# Patient Record
Sex: Female | Born: 1990 | Race: Black or African American | Hispanic: No | Marital: Married | Smoking: Never smoker
Health system: Southern US, Community
[De-identification: ages and names within clinical notes are randomized; demographics above are authoritative.]

## PROBLEM LIST (undated history)

## (undated) DIAGNOSIS — R87619 Unspecified abnormal cytological findings in specimens from cervix uteri: Secondary | ICD-10-CM

## (undated) DIAGNOSIS — K5900 Constipation, unspecified: Secondary | ICD-10-CM

## (undated) DIAGNOSIS — D563 Thalassemia minor: Secondary | ICD-10-CM

## (undated) HISTORY — DX: Thalassemia minor: D56.3

## (undated) HISTORY — PX: DG SELECTED HSG GDC ONLY: HXRAD357

## (undated) HISTORY — PX: HYSTEROSALPINGOGRAM: SHX6581

## (undated) HISTORY — DX: Unspecified abnormal cytological findings in specimens from cervix uteri: R87.619

---
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
## 2009-04-26 ENCOUNTER — Ambulatory Visit: Payer: Self-pay | Admitting: Pediatrics

## 2009-05-10 ENCOUNTER — Ambulatory Visit: Payer: Self-pay | Admitting: Internal Medicine

## 2010-07-17 DIAGNOSIS — R87619 Unspecified abnormal cytological findings in specimens from cervix uteri: Secondary | ICD-10-CM

## 2010-07-17 HISTORY — DX: Unspecified abnormal cytological findings in specimens from cervix uteri: R87.619

## 2013-02-20 ENCOUNTER — Ambulatory Visit: Payer: Self-pay | Admitting: Obstetrics and Gynecology

## 2013-11-25 ENCOUNTER — Ambulatory Visit: Payer: Self-pay | Admitting: Emergency Medicine

## 2014-01-25 ENCOUNTER — Ambulatory Visit: Payer: Self-pay | Admitting: Family Medicine

## 2014-01-25 LAB — COMPREHENSIVE METABOLIC PANEL
ALK PHOS: 42 U/L — AB
AST: 17 U/L (ref 15–37)
Albumin: 4.1 g/dL (ref 3.4–5.0)
Anion Gap: 9 (ref 7–16)
BILIRUBIN TOTAL: 0.5 mg/dL (ref 0.2–1.0)
BUN: 7 mg/dL (ref 7–18)
CALCIUM: 9 mg/dL (ref 8.5–10.1)
Chloride: 104 mmol/L (ref 98–107)
Co2: 26 mmol/L (ref 21–32)
Creatinine: 0.64 mg/dL (ref 0.60–1.30)
EGFR (African American): >60
GLUCOSE: 78 mg/dL (ref 65–99)
Osmolality: 274 (ref 275–301)
Potassium: 3.4 mmol/L — ABNORMAL LOW (ref 3.5–5.1)
SGPT (ALT): 20 U/L (ref 12–78)
Sodium: 139 mmol/L (ref 136–145)
Total Protein: 7.7 g/dL (ref 6.4–8.2)

## 2014-04-11 ENCOUNTER — Emergency Department: Payer: Self-pay | Admitting: Student

## 2014-08-28 LAB — HM PAP SMEAR: HM Pap smear: NORMAL

## 2015-05-09 ENCOUNTER — Ambulatory Visit
Admission: EM | Admit: 2015-05-09 | Discharge: 2015-05-09 | Disposition: A | Discharge status: Home / Self Care | Payer: BLUE CROSS/BLUE SHIELD | Attending: Family Medicine | Admitting: Family Medicine

## 2015-05-09 DIAGNOSIS — K649 Unspecified hemorrhoids: Secondary | ICD-10-CM | POA: Diagnosis not present

## 2015-05-09 HISTORY — DX: Constipation, unspecified: K59.00

## 2015-05-09 LAB — CBC WITH DIFFERENTIAL/PLATELET
BASOS ABS: 0.1 10*3/uL (ref 0–0.1)
BASOS PCT: 1 %
EOS PCT: 1 %
Eosinophils Absolute: 0.1 10*3/uL (ref 0–0.7)
HCT: 39.5 % (ref 35.0–47.0)
Hemoglobin: 12.9 g/dL (ref 12.0–16.0)
Lymphocytes Relative: 29 %
Lymphs Abs: 1.7 10*3/uL (ref 1.0–3.6)
MCH: 28.3 pg (ref 26.0–34.0)
MCHC: 32.7 g/dL (ref 32.0–36.0)
MCV: 86.6 fL (ref 80.0–100.0)
Monocytes Absolute: 0.6 10*3/uL (ref 0.2–0.9)
Monocytes Relative: 10 %
Neutro Abs: 3.6 10*3/uL (ref 1.4–6.5)
Neutrophils Relative %: 59 %
Platelets: 245 10*3/uL (ref 150–440)
RBC: 4.56 MIL/uL (ref 3.80–5.20)
RDW: 12.9 % (ref 11.5–14.5)
WBC: 6.1 10*3/uL (ref 3.6–11.0)

## 2015-05-09 LAB — OCCULT BLOOD X 1 CARD TO LAB, STOOL: FECAL OCCULT BLD: POSITIVE — AB

## 2015-05-09 MED ORDER — HYDROCORTISONE ACE-PRAMOXINE 1-1 % RE FOAM
1.0000 | Freq: Two times a day (BID) | RECTAL | Status: AC
Start: 2015-05-09 — End: 2015-05-16

## 2015-05-09 MED ORDER — DOCUSATE SODIUM 100 MG PO CAPS: 100.0000 mg | ORAL_CAPSULE | Freq: Two times a day (BID) | ORAL | Status: DC

## 2015-05-09 NOTE — ED Provider Notes (Signed)
[facility] Emergency Department Provider Note  ____________________________________________  Time seen: Approximately 10:26 AM  I have reviewed the triage vital signs and the nursing notes.   HISTORY  Chief Complaint Hemorrhoids   HPI Maureen Black is a 24 y.o. female presents for complaint of hemorrhoid pain. Patient reports that she has intermittently had hemorrhoids times years. Reports this past Monday she felt like she had to strain during the bowel movement and states that her stool was hard. Patient states that after the bowel movement she wiped and noticed a small amount of blood on the tissue. Patient reports that throughout the week she has had some intermittent rectal discomfort and states that she looked at the area and it appeared to be a hemorrhoid. Patient states the discomfort is only when walking or with bowel movement. Patient states that this morning was her last bowel movement. States that her bowel movement this morning was fairly normal and did not really have to strain. reports that when she wiped this morning she had bright red blood present on the tissue. States that she noticed 1-2 drops of blood in the toilet water. But states that she saw that it came from the hemorrhoid. Patient denies blood in stool. Denies bleeding in absence of bowel movement. States no bleeding regularly. Denies vaginal bleeding or blood in urine. Reports continues to pass flatus well.   Patient reports that she continues to eat and drink well. Denies nausea, vomiting or diarrhea. Patient reports that she has a chronic history of constipation and her current bowel movements are similar to her normal pattern. States that she normally has a bowel movement once or twice per week. States that this is have been her pattern for years. Patient again denies nausea, vomiting, abdominal pain. Patient states at this time the hemorrhoid is not hurting her but earlier today pain was  4/10. States that pain for the hemorrhoid today was only during bowel movement. Patient states that she is not in the past had hemorrhoids that bled. Denies rectal or vaginal trauma.  Patient reports last ventral cycle was 2 weeks ago. Patient states that she is not currently sexually active and denies chance of pregnancy.   Past Medical History  Diagnosis Date  . Constipation   Hemorrhoids  There are no active problems to display for this patient.   History reviewed. No pertinent past surgical history.  Current Outpatient Rx  Name  Route  Sig  Dispense  Refill  .           Marland Kitchen             Allergies Review of patient's allergies indicates no known allergies.  Family History  Problem Relation Age of Onset  . Hypertension Mother     Social History Social History  Substance Use Topics  . Smoking status: Former Games developer  . Smokeless tobacco: None  . Alcohol Use: Yes     Comment: socially    Review of Systems Constitutional: No fever/chills Eyes: No visual changes. ENT: No sore throat. Cardiovascular: Denies chest pain. Respiratory: Denies shortness of breath. Gastrointestinal: No abdominal pain.  No nausea, no vomiting.  No diarrhea. Positive intermittent constipation and hemorrhoids.  Genitourinary: Negative for dysuria. Musculoskeletal: Negative for back pain. Skin: Negative for rash. Neurological: Negative for headaches, focal weakness or numbness.  10-point ROS otherwise negative.  ____________________________________________   PHYSICAL EXAM:  VITAL SIGNS: ED Triage Vitals  Enc Vitals Group     BP 05/09/15 0926  106/67 mmHg     Pulse Rate 05/09/15 0926 82     Resp 05/09/15 0926 16     Temp 05/09/15 0926 98 F (36.7 C)     Temp Source 05/09/15 0926 Oral     SpO2 05/09/15 0926 100 %     Weight 05/09/15 0926 105 lb (47.628 kg)     Height 05/09/15 0926  (1.575 m)     Head Cir --      Peak Flow --      Pain Score 05/09/15 0928 4     Pain Loc --       Pain Edu? --      Excl. in GC? --     Constitutional: Alert and oriented. Well appearing and in no acute distress. Eyes: Conjunctivae are normal. PERRL. EOMI. Head: Atraumatic.  Nose: No congestion/rhinnorhea.  Mouth/Throat: Mucous membranes are moist.  Oropharynx non-erythematous. Neck: No stridor.  No cervical spine tenderness to palpation. Hematological/Lymphatic/Immunilogical: No cervical lymphadenopathy. Cardiovascular: Normal rate, regular rhythm. Grossly normal heart sounds.  Good peripheral circulation. Respiratory: Normal respiratory effort.  No retractions. Lungs CTAB. Gastrointestinal: Soft and nontender. No distention. Normal Bowel sounds. No CVA tenderness. Rectal: Exam completed with RN Beryle Quant at beside. Patient with external non-thrombosed posterior hemorrhoid present, mild to mod TTP. Rectal exam performed with normal stool color and no gross blood present, however external hemorrhoid with scant amount blood present. No internal hemorrhoid palpated.  Musculoskeletal: No lower or upper extremity tenderness nor edema.  No joint effusions. Bilateral pedal pulses equal and easily palpated.  Neurologic:  Normal speech and language. No gross focal neurologic deficits are appreciated. No gait instability. Skin:  Skin is warm, dry and intact. No rash noted. Psychiatric: Mood and affect are normal. Speech and behavior are normal.  ____________________________________________   LABS (all labs ordered are listed, but only abnormal results are displayed)  Labs Reviewed  OCCULT BLOOD X 1 CARD TO LAB, STOOL - Abnormal; Notable for the following:    Fecal Occult Bld POSITIVE (*)    All other components within normal limits  CBC WITH DIFFERENTIAL/PLATELET   _______________________________________   INITIAL IMPRESSION / ASSESSMENT AND PLAN / ED COURSE  Pertinent labs & imaging results that were available during my care of the patient were reviewed by me and considered in my  medical decision making (see chart for details).  Very well-appearing patient. No acute distress. Presents for the complaint of hemorrhoid. Patient reports that last bowel movement was this morning and states that she had some bright red blood present on tissue after wiping. Abdomen soft and nontender with normal bowel sounds. Patient with external nonthrombosed hemorrhoid present with scant amount of freshly clotted blood at hemorrhoid. Rectal exam was completed with RN at bedside. Stool normal color however fecal occult was positive, suspect this was due to hemorrhoid that had blood present. No gross blood present. As occult was positive also evaluate CBC. Suspect occult positive due to hemorrhoid.   CBC reviewed, and within normal parameters. Discussed in detail with patient and mother at bedside for patient to closely monitor symptoms. As well as to have close follow-up with primary care physician. Patient is to follow-up with PCP in the next 2-3 days with primary care physician. Will start patient on oral Metamucil as well as stool softener, and patient to apply Proctofoam HC. States will get metamucil over the counter. Discussed good bowel habits, water intake, and to avoid straining with bowel movements. Counseled regarding needs to  return immediately or proceed to the ER including abdominal pain, rectal bleeding, increased pain, inability to pass stool,  blood in stool or filling toilet,  fever, worsening concerns.Discussed follow up with Primary care physician this week. Discussed follow up and return parameters including no resolution or any worsening concerns. Patient verbalized understanding and agreed to plan.    ____________________________________________   FINAL CLINICAL IMPRESSION(S) / ED DIAGNOSES  Final diagnoses:  Hemorrhoids, unspecified hemorrhoid type       Renford DillsLindsey Reyaan Thoma, NP 05/09/15 1122  Renford DillsLindsey Shia Delaine, NP 05/09/15 1124

## 2015-05-09 NOTE — ED Notes (Signed)
External hemorrhoid started Monday. States very large in size. This morning states noticed dry blood when wiping, then after a BM, has "a lot of blood"

## 2015-05-09 NOTE — Discharge Instructions (Signed)
Take medication as prescribed. Drink plenty of water. Use over-the-counter Metamucil per label instructions as discussed.  As discussed you need to follow-up closely with primary care physician this week. You can also follow up with gastroenterology, see above as needed. Return to urgent care proceed to the ER for abdominal pain, fever, bleeding, blood fill-in toilet bowl, blood in stool, new or worsening concerns.  Hemorrhoids Hemorrhoids are swollen veins around the rectum or anus. There are two types of hemorrhoids:   Internal hemorrhoids. These occur in the veins just inside the rectum. They may poke through to the outside and become irritated and painful.  External hemorrhoids. These occur in the veins outside the anus and can be felt as a painful swelling or hard lump near the anus. CAUSES  Pregnancy.   Obesity.   Constipation or diarrhea.   Straining to have a bowel movement.   Sitting for long periods on the toilet.  Heavy lifting or other activity that caused you to strain.  Anal intercourse. SYMPTOMS   Pain.   Anal itching or irritation.   Rectal bleeding.   Fecal leakage.   Anal swelling.   One or more lumps around the anus.  DIAGNOSIS  Your caregiver may be able to diagnose hemorrhoids by visual examination. Other examinations or tests that may be performed include:   Examination of the rectal area with a gloved hand (digital rectal exam).   Examination of anal canal using a small tube (scope).   A blood test if you have lost a significant amount of blood.  A test to look inside the colon (sigmoidoscopy or colonoscopy). TREATMENT Most hemorrhoids can be treated at home. However, if symptoms do not seem to be getting better or if you have a lot of rectal bleeding, your caregiver may perform a procedure to help make the hemorrhoids get smaller or remove them completely. Possible treatments include:   Placing a rubber band at the base of the  hemorrhoid to cut off the circulation (rubber band ligation).   Injecting a chemical to shrink the hemorrhoid (sclerotherapy).   Using a tool to burn the hemorrhoid (infrared light therapy).   Surgically removing the hemorrhoid (hemorrhoidectomy).   Stapling the hemorrhoid to block blood flow to the tissue (hemorrhoid stapling).  HOME CARE INSTRUCTIONS   Eat foods with fiber, such as whole grains, beans, nuts, fruits, and vegetables. Ask your doctor about taking products with added fiber in them (fibersupplements).  Increase fluid intake. Drink enough water and fluids to keep your urine clear or pale yellow.   Exercise regularly.   Go to the bathroom when you have the urge to have a bowel movement. Do not wait.   Avoid straining to have bowel movements.   Keep the anal area dry and clean. Use wet toilet paper or moist towelettes after a bowel movement.   Medicated creams and suppositories may be used or applied as directed.   Only take over-the-counter or prescription medicines as directed by your caregiver.   Take warm sitz baths for 15-20 minutes, 3-4 times a day to ease pain and discomfort.   Place ice packs on the hemorrhoids if they are tender and swollen. Using ice packs between sitz baths may be helpful.   Put ice in a plastic bag.   Place a towel between your skin and the bag.   Leave the ice on for 15-20 minutes, 3-4 times a day.   Do not use a donut-shaped pillow or sit on the toilet  for long periods. This increases blood pooling and pain.  SEEK MEDICAL CARE IF:  You have increasing pain and swelling that is not controlled by treatment or medicine.  You have uncontrolled bleeding.  You have difficulty or you are unable to have a bowel movement.  You have pain or inflammation outside the area of the hemorrhoids. MAKE SURE YOU:  Understand these instructions.  Will watch your condition.  Will get help right away if you are not doing well  or get worse.   This information is not intended to replace advice given to you by your health care provider. Make sure you discuss any questions you have with your health care provider.   Document Released: 06/30/2000 Document Revised: 06/19/2012 Document Reviewed: 05/07/2012 Elsevier Interactive Patient Education 2016 Elsevier Inc.  High-Fiber Diet Fiber, also called dietary fiber, is a type of carbohydrate found in fruits, vegetables, whole grains, and beans. A high-fiber diet can have many health benefits. Your health care provider may recommend a high-fiber diet to help:  Prevent constipation. Fiber can make your bowel movements more regular.  Lower your cholesterol.  Relieve hemorrhoids, uncomplicated diverticulosis, or irritable bowel syndrome.  Prevent overeating as part of a weight-loss plan.  Prevent heart disease, type 2 diabetes, and certain cancers. WHAT IS MY PLAN? The recommended daily intake of fiber includes:  38 grams for men under age 26.  30 grams for men over age 26.  25 grams for women under age 26.  21 grams for women over age 350. You can get the recommended daily intake of dietary fiber by eating a variety of fruits, vegetables, grains, and beans. Your health care provider may also recommend a fiber supplement if it is not possible to get enough fiber through your diet. WHAT DO I NEED TO KNOW ABOUT A HIGH-FIBER DIET?  Fiber supplements have not been widely studied for their effectiveness, so it is better to get fiber through food sources.  Always check the fiber content on thenutrition facts label of any prepackaged food. Look for foods that contain at least 5 grams of fiber per serving.  Ask your dietitian if you have questions about specific foods that are related to your condition, especially if those foods are not listed in the following section.  Increase your daily fiber consumption gradually. Increasing your intake of dietary fiber too quickly  may cause bloating, cramping, or gas.  Drink plenty of water. Water helps you to digest fiber. WHAT FOODS CAN I EAT? Grains Whole-grain breads. Multigrain cereal. Oats and oatmeal. Brown rice. Barley. Bulgur wheat. Millet. Bran muffins. Popcorn. Rye wafer crackers. Vegetables Sweet potatoes. Spinach. Kale. Artichokes. Cabbage. Broccoli. Green peas. Carrots. Squash. Fruits Berries. Pears. Apples. Oranges. Avocados. Prunes and raisins. Dried figs. Meats and Other Protein Sources Navy, kidney, pinto, and soy beans. Split peas. Lentils. Nuts and seeds. Dairy Fiber-fortified yogurt. Beverages Fiber-fortified soy milk. Fiber-fortified orange juice. Other Fiber bars. The items listed above may not be a complete list of recommended foods or beverages. Contact your dietitian for more options. WHAT FOODS ARE NOT RECOMMENDED? Grains White bread. Pasta made with refined flour. White rice. Vegetables Fried potatoes. Canned vegetables. Well-cooked vegetables.  Fruits Fruit juice. Cooked, strained fruit. Meats and Other Protein Sources Fatty cuts of meat. Fried Environmental education officerpoultry or fried fish. Dairy Milk. Yogurt. Cream cheese. Sour cream. Beverages Soft drinks. Other Cakes and pastries. Butter and oils. The items listed above may not be a complete list of foods and beverages to avoid. Contact your  dietitian for more information. WHAT ARE SOME TIPS FOR INCLUDING HIGH-FIBER FOODS IN MY DIET?  Eat a wide variety of high-fiber foods.  Make sure that half of all grains consumed each day are whole grains.  Replace breads and cereals made from refined flour or white flour with whole-grain breads and cereals.  Replace white rice with brown rice, bulgur wheat, or millet.  Start the day with a breakfast that is high in fiber, such as a cereal that contains at least 5 grams of fiber per serving.  Use beans in place of meat in soups, salads, or pasta.  Eat high-fiber snacks, such as berries, raw  vegetables, nuts, or popcorn.   This information is not intended to replace advice given to you by your health care provider. Make sure you discuss any questions you have with your health care provider.   Document Released: 07/03/2005 Document Revised: 07/24/2014 Document Reviewed: 12/16/2013 Elsevier Interactive Patient Education [employer]

## 2017-07-01 ENCOUNTER — Other Ambulatory Visit: Payer: Self-pay

## 2017-07-01 ENCOUNTER — Ambulatory Visit
Admission: EM | Admit: 2017-07-01 | Discharge: 2017-07-01 | Disposition: A | Discharge status: Home / Self Care | Payer: BLUE CROSS/BLUE SHIELD | Attending: Family Medicine | Admitting: Family Medicine

## 2017-07-01 DIAGNOSIS — R350 Frequency of micturition: Secondary | ICD-10-CM | POA: Diagnosis not present

## 2017-07-01 DIAGNOSIS — N390 Urinary tract infection, site not specified: Secondary | ICD-10-CM | POA: Diagnosis not present

## 2017-07-01 DIAGNOSIS — N898 Other specified noninflammatory disorders of vagina: Secondary | ICD-10-CM

## 2017-07-01 DIAGNOSIS — R300 Dysuria: Secondary | ICD-10-CM | POA: Diagnosis not present

## 2017-07-01 DIAGNOSIS — R319 Hematuria, unspecified: Secondary | ICD-10-CM | POA: Diagnosis not present

## 2017-07-01 LAB — URINALYSIS, COMPLETE (UACMP) WITH MICROSCOPIC
Bilirubin Urine: NEGATIVE
Glucose, UA: NEGATIVE mg/dL
Ketones, ur: NEGATIVE mg/dL
Nitrite: NEGATIVE
Protein, ur: NEGATIVE mg/dL
SPECIFIC GRAVITY, URINE: 1.025 (ref 1.005–1.030)
pH: 6.5 (ref 5.0–8.0)

## 2017-07-01 LAB — WET PREP, GENITAL
CLUE CELLS WET PREP: NONE SEEN
SPERM: NONE SEEN
Trich, Wet Prep: NONE SEEN
YEAST WET PREP: NONE SEEN

## 2017-07-01 MED ORDER — CEPHALEXIN 500 MG PO CAPS: 500.0000 mg | ORAL_CAPSULE | Freq: Two times a day (BID) | ORAL | 0 refills | Status: AC

## 2017-07-01 NOTE — ED Triage Notes (Signed)
Pt with 4 days of dysuria (mild) but much worse starting last night. Also started to notice frequency.

## 2017-07-01 NOTE — Discharge Instructions (Signed)
Take medication as prescribed. Rest. Drink plenty of fluids.  ° °Follow up with your primary care physician this week as needed. Return to Urgent care for new or worsening concerns.  ° °

## 2017-07-01 NOTE — ED Provider Notes (Signed)
[facility] ____________________________________________  Time seen: Approximately 0900 am  I have reviewed the triage vital signs and the nursing notes.   HISTORY  Chief Complaint Dysuria   HPI Maureen Terisa StarrKingsberry Black is a 26 y.o. female presenting for evaluation of 3-4 days of urinary frequency, urinary urgency that has been mild, but worsened last night.  Patient reports slight discomfort with urination and feeling of needing to more frequently return back to the restroom.  States she did notice very mild vaginal discharge 2 days ago.  States not persistent.  Denies concerns of STDs.  Denies abdominal pain, back pain, pelvic pain, fevers, vomiting or diarrhea.  Reports continues to eat and drink well.  States does not drink water as much as she should, but denies other known triggers.  No alleviating measures attempted at home.  Denies aggravating or alleviating factors.  Reports otherwise feels well.  Denies chest pain, shortness of breath, or rash. Denies recent sickness. Denies recent antibiotic use.   Patient's last menstrual period was 06/17/2017.  Denies pregnancy   Past Medical History:  Diagnosis Date  . Constipation     There are no active problems to display for this patient.   History reviewed. No pertinent surgical history.   No current facility-administered medications for this encounter.   Current Outpatient Medications:  .  cephALEXin (KEFLEX) 500 MG capsule, Take 1 capsule (500 mg total) by mouth 2 (two) times daily for 7 days., Disp: 14 capsule, Rfl: 0  Allergies Patient has no known allergies.  Family History  Problem Relation Age of Onset  . Hypertension Mother     Social History Social History   Tobacco Use  . Smoking status: Never Smoker  . Smokeless tobacco: Never Used  Substance Use Topics  . Alcohol use: Yes    Comment: socially  . Drug use: No    Review of Systems Constitutional: No fever/chills Cardiovascular:  Denies chest pain. Respiratory: Denies shortness of breath. Gastrointestinal: No abdominal pain.  No nausea, no vomiting.   Genitourinary: Positive for dysuria. Musculoskeletal: Negative for back pain. Skin: Negative for rash.  ____________________________________________   PHYSICAL EXAM:  VITAL SIGNS: ED Triage Vitals  Enc Vitals Group     BP 07/01/17 0845 113/68     Pulse Rate 07/01/17 0845 81     Resp 07/01/17 0845 16     Temp 07/01/17 0845 98.6 F (37 C)     Temp Source 07/01/17 0845 Oral     SpO2 07/01/17 0845 100 %     Weight 07/01/17 0845 120 lb (54.4 kg)     Height 07/01/17 0845 5\' 4"  (1.626 m)     Head Circumference --      Peak Flow --      Pain Score 07/01/17 0936 0     Pain Loc --      Pain Edu? --      Excl. in GC? --     Constitutional: Alert and oriented. Well appearing and in no acute distress. Cardiovascular: Normal rate, regular rhythm. Grossly normal heart sounds.  Good peripheral circulation. Respiratory: Normal respiratory effort without tachypnea nor retractions. Breath sounds are clear and equal bilaterally. No wheezes, rales, rhonchi. Gastrointestinal: Soft and nontender. No CVA tenderness. Musculoskeletal:  No midline cervical, thoracic or lumbar tenderness to palpation. Neurologic:  Normal speech and language.Speech is normal. No gait instability.  Skin:  Skin is warm, dry and intact. No rash noted. Psychiatric: Mood and affect are normal. Speech and behavior are  normal. Patient exhibits appropriate insight and judgment   ___________________________________________   LABS (all labs ordered are listed, but only abnormal results are displayed)  Labs Reviewed  WET PREP, GENITAL - Abnormal; Notable for the following components:      Result Value   WBC, Wet Prep HPF POC FEW (*)    All other components within normal limits  URINALYSIS, COMPLETE (UACMP) WITH MICROSCOPIC - Abnormal; Notable for the following components:   APPearance CLOUDY (*)     Hgb urine dipstick SMALL (*)    Leukocytes, UA SMALL (*)    Squamous Epithelial / LPF 6-30 (*)    Bacteria, UA MANY (*)    All other components within normal limits  URINE CULTURE     PROCEDURES Procedures    INITIAL IMPRESSION / ASSESSMENT AND PLAN / ED COURSE  Pertinent labs & imaging results that were available during my care of the patient were reviewed by me and considered in my medical decision making (see chart for details).  Well-appearing patient.  No acute distress.  Urinalysis reviewed, suspect UTI will culture urine.  As patient had some vaginal discharge, declined pelvic, she elected to do self wet prep.  Wet prep reviewed, overall unremarkable.  Will treat patient with oral Keflex.  Encouraged rest, fluids, supportive care.Discussed indication, risks and benefits of medications with patient.  Discussed follow up with Primary care physician this week. Discussed follow up and return parameters including no resolution or any worsening concerns. Patient verbalized understanding and agreed to plan.   ____________________________________________   FINAL CLINICAL IMPRESSION(S) / ED DIAGNOSES  Final diagnoses:  Urinary tract infection with hematuria, site unspecified     ED Discharge Orders        Ordered    cephALEXin (KEFLEX) 500 MG capsule  2 times daily     07/01/17 0933       Note: This dictation was prepared with Dragon dictation along with smaller phrase technology. Any transcriptional errors that result from this process are unintentional.         Renford DillsMiller, Kambrey Hagger, NP 07/01/17 1754

## 2017-07-03 DIAGNOSIS — N979 Female infertility, unspecified: Secondary | ICD-10-CM | POA: Diagnosis not present

## 2017-07-03 LAB — URINE CULTURE

## 2017-07-04 DIAGNOSIS — N979 Female infertility, unspecified: Secondary | ICD-10-CM | POA: Diagnosis not present

## 2017-07-04 DIAGNOSIS — Z31430 Encounter of female for testing for genetic disease carrier status for procreative management: Secondary | ICD-10-CM | POA: Diagnosis not present

## 2017-07-04 DIAGNOSIS — E559 Vitamin D deficiency, unspecified: Secondary | ICD-10-CM | POA: Diagnosis not present

## 2017-07-22 DIAGNOSIS — N979 Female infertility, unspecified: Secondary | ICD-10-CM | POA: Diagnosis not present

## 2017-08-01 DIAGNOSIS — N979 Female infertility, unspecified: Secondary | ICD-10-CM | POA: Diagnosis not present

## 2017-12-30 ENCOUNTER — Encounter: Payer: Self-pay | Admitting: Gynecology

## 2017-12-30 ENCOUNTER — Ambulatory Visit
Admission: EM | Admit: 2017-12-30 | Discharge: 2017-12-30 | Disposition: A | Discharge status: Home / Self Care | Payer: BLUE CROSS/BLUE SHIELD | Attending: Emergency Medicine | Admitting: Emergency Medicine

## 2017-12-30 ENCOUNTER — Other Ambulatory Visit: Payer: Self-pay

## 2017-12-30 DIAGNOSIS — N390 Urinary tract infection, site not specified: Secondary | ICD-10-CM | POA: Diagnosis not present

## 2017-12-30 DIAGNOSIS — R319 Hematuria, unspecified: Secondary | ICD-10-CM

## 2017-12-30 DIAGNOSIS — R3915 Urgency of urination: Secondary | ICD-10-CM | POA: Diagnosis not present

## 2017-12-30 DIAGNOSIS — R300 Dysuria: Secondary | ICD-10-CM | POA: Diagnosis not present

## 2017-12-30 LAB — URINALYSIS, COMPLETE (UACMP) WITH MICROSCOPIC
Bilirubin Urine: NEGATIVE
Glucose, UA: NEGATIVE mg/dL
KETONES UR: NEGATIVE mg/dL
Nitrite: NEGATIVE
PH: 6 (ref 5.0–8.0)
PROTEIN: NEGATIVE mg/dL
Specific Gravity, Urine: 1.02 (ref 1.005–1.030)

## 2017-12-30 MED ORDER — CEPHALEXIN 500 MG PO CAPS: 500.0000 mg | ORAL_CAPSULE | Freq: Two times a day (BID) | ORAL | 0 refills | Status: AC

## 2017-12-30 NOTE — Discharge Instructions (Addendum)
Take medication as prescribed. Rest.  Increase water intake, urinate post intercourse as discussed.  Follow up with your primary care physician this week as needed. Return to Urgent care for new or worsening concerns.

## 2017-12-30 NOTE — ED Triage Notes (Signed)
Patient urgency to urinate/ painful and odor.

## 2017-12-30 NOTE — ED Provider Notes (Signed)
[facility] ____________________________________________  Time seen: Approximately 11:02 AM  I have reviewed the triage vital signs and the nursing notes.   HISTORY  Chief Complaint Recurrent UTI   HPI Maureen Black is a 27 y.o. female for evaluation of urinary frequency, urinary urgency and some discomfort with urination present for the last 2 days.  Denies any vaginal discomfort, vaginal discharge or vaginal complaints.  Denies associated abdominal pain, back pain or fevers.  Reports continues to still eat and drink normally.  Patient states that she drinks more sodas more than water, and states this may be a possible trigger. No alleviating measures attempted prior to arrival.  Reports has had a few urinary tract infections in the past with similar presentation, last being in December 2018.  Denies other aggravating or alleviating factors.  Reports otherwise feels well.  Denies recent antibiotic use.  Patient's last menstrual period was 12/09/2017.Denies pregnancy    Past Medical History:  Diagnosis Date  . Constipation     There are no active problems to display for this patient.   History reviewed. No pertinent surgical history.   No current facility-administered medications for this encounter.   Current Outpatient Medications:  .  cephALEXin (KEFLEX) 500 MG capsule, Take 1 capsule (500 mg total) by mouth 2 (two) times daily for 7 days., Disp: 14 capsule, Rfl: 0  Allergies Patient has no known allergies.  Family History  Problem Relation Age of Onset  . Hypertension Mother     Social History Social History   Tobacco Use  . Smoking status: Never Smoker  . Smokeless tobacco: Never Used  Substance Use Topics  . Alcohol use: Yes    Comment: socially  . Drug use: No    Review of Systems Constitutional: No fever/chills Cardiovascular: Denies chest pain. Respiratory: Denies shortness of breath. Gastrointestinal: No abdominal pain.     Genitourinary: positive for dysuria. Musculoskeletal: Negative for back pain. Skin: Negative for rash.   ____________________________________________   PHYSICAL EXAM:  VITAL SIGNS: ED Triage Vitals  Enc Vitals Group     BP 12/30/17 1027 119/82     Pulse Rate 12/30/17 1027 80     Resp 12/30/17 1027 16     Temp 12/30/17 1027 98.1 F (36.7 C)     Temp Source 12/30/17 1027 Oral     SpO2 12/30/17 1027 100 %     Weight 12/30/17 1029 125 lb (56.7 kg)     Height 12/30/17 1029 5\' 4"  (1.626 m)     Head Circumference --      Peak Flow --      Pain Score 12/30/17 1027 1     Pain Loc --      Pain Edu? --      Excl. in GC? --     Constitutional: Alert and oriented. Well appearing and in no acute distress. ENT      Head: Normocephalic and atraumatic. Cardiovascular: Normal rate, regular rhythm. Grossly normal heart sounds.  Good peripheral circulation. Respiratory: Normal respiratory effort without tachypnea nor retractions. Breath sounds are clear and equal bilaterally. No wheezes, rales, rhonchi. Gastrointestinal: Soft and nontender.No CVA tenderness. Musculoskeletal:  No midline cervical, thoracic or lumbar tenderness to palpation. Neurologic:  Normal speech and language. Speech is normal. No gait instability.  Skin:  Skin is warm, dry Psychiatric: Mood and affect are normal. Speech and behavior are normal. Patient exhibits appropriate insight and judgment   ___________________________________________   LABS (all labs ordered are listed, but  only abnormal results are displayed)  Labs Reviewed  URINALYSIS, COMPLETE (UACMP) WITH MICROSCOPIC - Abnormal; Notable for the following components:      Result Value   APPearance HAZY (*)    Hgb urine dipstick MODERATE (*)    Leukocytes, UA TRACE (*)    Bacteria, UA FEW (*)    All other components within normal limits    PROCEDURES Procedures    INITIAL IMPRESSION / ASSESSMENT AND PLAN / ED COURSE  Pertinent labs & imaging  results that were available during my care of the patient were reviewed by me and considered in my medical decision making (see chart for details).  Well-appearing patient.  No acute distress.  Urinalysis reviewed, suspect UTI.  Will treat with oral Keflex.  Encourage rest, fluids, supportive care.  And further discussed with patient also encouraged to avoid post sexual intercourse as well as drink more water. Discussed indication, risks and benefits of medications with patient.   Discussed follow up with Primary care physician this week. Discussed follow up and return parameters including no resolution or any worsening concerns. Patient verbalized understanding and agreed to plan.   ____________________________________________   FINAL CLINICAL IMPRESSION(S) / ED DIAGNOSES  Final diagnoses:  Urinary tract infection with hematuria, site unspecified     ED Discharge Orders        Ordered    cephALEXin (KEFLEX) 500 MG capsule  2 times daily     12/30/17 1107       Note: This dictation was prepared with Dragon dictation along with smaller phrase technology. Any transcriptional errors that result from this process are unintentional.         Renford Dills, NP 12/30/17 1122

## 2018-01-08 ENCOUNTER — Ambulatory Visit: Payer: Self-pay | Admitting: Obstetrics and Gynecology

## 2018-02-26 DIAGNOSIS — N979 Female infertility, unspecified: Secondary | ICD-10-CM | POA: Diagnosis not present

## 2018-03-11 DIAGNOSIS — N979 Female infertility, unspecified: Secondary | ICD-10-CM | POA: Diagnosis not present

## 2018-03-20 DIAGNOSIS — N979 Female infertility, unspecified: Secondary | ICD-10-CM | POA: Diagnosis not present

## 2018-03-22 DIAGNOSIS — Z3189 Encounter for other procreative management: Secondary | ICD-10-CM | POA: Diagnosis not present

## 2018-03-28 DIAGNOSIS — N979 Female infertility, unspecified: Secondary | ICD-10-CM | POA: Diagnosis not present

## 2018-05-22 ENCOUNTER — Encounter

## 2018-05-22 ENCOUNTER — Ambulatory Visit (INDEPENDENT_AMBULATORY_CARE_PROVIDER_SITE_OTHER): Payer: BLUE CROSS/BLUE SHIELD | Admitting: Obstetrics and Gynecology

## 2018-05-22 ENCOUNTER — Other Ambulatory Visit (HOSPITAL_COMMUNITY)
Admission: RE | Admit: 2018-05-22 | Discharge: 2018-05-22 | Disposition: A | Discharge status: Home / Self Care | Payer: BLUE CROSS/BLUE SHIELD | Source: Ambulatory Visit | Attending: Obstetrics and Gynecology | Admitting: Obstetrics and Gynecology

## 2018-05-22 ENCOUNTER — Encounter: Payer: Self-pay | Admitting: Obstetrics and Gynecology

## 2018-05-22 VITALS — BP 116/74 | Ht 64.0 in | Wt 130.0 lb

## 2018-05-22 DIAGNOSIS — Z01419 Encounter for gynecological examination (general) (routine) without abnormal findings: Secondary | ICD-10-CM

## 2018-05-22 DIAGNOSIS — Z113 Encounter for screening for infections with a predominantly sexual mode of transmission: Secondary | ICD-10-CM

## 2018-05-22 DIAGNOSIS — Z124 Encounter for screening for malignant neoplasm of cervix: Secondary | ICD-10-CM

## 2018-05-22 DIAGNOSIS — Z1339 Encounter for screening examination for other mental health and behavioral disorders: Secondary | ICD-10-CM

## 2018-05-22 DIAGNOSIS — Z1331 Encounter for screening for depression: Secondary | ICD-10-CM

## 2018-05-22 NOTE — Progress Notes (Signed)
Gynecology Annual Exam  PCP: Patient, No Pcp Per  Chief Complaint  Patient presents with  . Annual Exam    NP Annual    History of Present Illness:  Ms. Maureen Black is a 27 y.o. G0P0000 who LMP was Patient's last menstrual period was 05/07/2018., presents today for her annual examination.  Her menses are regular every 28-30 days, lasting 5 day(s).  Dysmenorrhea mild, occurring first 1-2 days of flow. She does not have intermenstrual bleeding.  She is single partner, contraception - none.  Getting ready to start an IVF cycle at [facility].  Last Pap: 08/28/2014  Results were: no abnormalities (HPV not done) Hx of STDs: none  There is no FH of breast cancer. There is no FH of ovarian cancer. The patient does not do self-breast exams.  Tobacco use: The patient denies current or previous tobacco use. Alcohol use: social drinker Exercise: not active  The patient wears seatbelts: yes.   The patient reports that domestic violence in her life is absent.   Past Medical History:  Diagnosis Date  . Abnormal Pap smear of cervix 2012  . Constipation    Past Surgical History:  Procedure Laterality Date  . DG SELECTED HSG [facility] ONLY    . HYSTEROSALPINGOGRAM     Prior to Admission medications   Denies   Allergies: No Known Allergies  Obstetric History: G0P0000  Social History   Socioeconomic History  . Marital status: Single    Spouse name: Not on file  . Number of children: Not on file  . Years of education: Not on file  . Highest education level: Not on file  Occupational History  . Not on file  Social Needs  . Financial resource strain: Not on file  . Food insecurity:    Worry: Not on file    Inability: Not on file  . Transportation needs:    Medical: Not on file    Non-medical: Not on file  Tobacco Use  . Smoking status: Never Smoker  . Smokeless tobacco: Never Used  Substance and Sexual Activity  . Alcohol use: Yes    Comment: socially  .  Drug use: No  . Sexual activity: Yes    Birth control/protection: None  Lifestyle  . Physical activity:    Days per week: Not on file    Minutes per session: Not on file  . Stress: Not on file  Relationships  . Social connections:    Talks on phone: Not on file    Gets together: Not on file    Attends religious service: Not on file    Active member of club or organization: Not on file    Attends meetings of clubs or organizations: Not on file    Relationship status: Not on file  . Intimate partner violence:    Fear of current or ex partner: Not on file    Emotionally abused: Not on file    Physically abused: Not on file    Forced sexual activity: Not on file  Other Topics Concern  . Not on file  Social History Narrative  . Not on file    Family History  Problem Relation Age of Onset  . Hypertension Mother   . Diabetes Maternal Grandmother     Review of Systems  Constitutional: Negative.   HENT: Negative.   Eyes: Negative.   Respiratory: Negative.   Cardiovascular: Negative.   Gastrointestinal: Negative.   Genitourinary: Negative.   Musculoskeletal: Negative.  Skin: Negative.   Neurological: Negative.   Psychiatric/Behavioral: Negative.     Physical Exam BP 116/74   Ht 5\' 4"  (1.626 m)   Wt 130 lb (59 kg)   LMP 05/07/2018   BMI 22.31 kg/m    Physical Exam  Constitutional: She is oriented to person, place, and time. She appears well-developed and well-nourished. No distress.  Genitourinary: Uterus normal. Pelvic exam was performed with patient supine. There is no rash, tenderness, lesion or injury on the right labia. There is no rash, tenderness, lesion or injury on the left labia. No erythema, tenderness or bleeding in the vagina. No signs of injury around the vagina. No vaginal discharge found. Right adnexum does not display mass, does not display tenderness and does not display fullness. Left adnexum does not display mass, does not display tenderness and does  not display fullness. Cervix does not exhibit motion tenderness, lesion, discharge or polyp.   Uterus is mobile and anteverted. Uterus is not enlarged, tender or exhibiting a mass.  HENT:  Head: Normocephalic and atraumatic.  Eyes: EOM are normal. No scleral icterus.  Neck: Normal range of motion. Neck supple. No thyromegaly present.  Cardiovascular: Normal rate and regular rhythm. Exam reveals no gallop and no friction rub.  No murmur heard. Pulmonary/Chest: Effort normal and breath sounds normal. No respiratory distress. She has no wheezes. She has no rales. Right breast exhibits no inverted nipple, no mass, no nipple discharge, no skin change and no tenderness. Left breast exhibits no inverted nipple, no mass, no nipple discharge, no skin change and no tenderness.  Abdominal: Soft. Bowel sounds are normal. She exhibits no distension and no mass. There is no tenderness. There is no rebound and no guarding.  Musculoskeletal: Normal range of motion. She exhibits no edema or tenderness.  Lymphadenopathy:    She has no cervical adenopathy.       Right: No inguinal adenopathy present.       Left: No inguinal adenopathy present.  Neurological: She is alert and oriented to person, place, and time. No cranial nerve deficit.  Skin: Skin is warm and dry. No rash noted. No erythema.  Psychiatric: She has a normal mood and affect. Her behavior is normal. Judgment normal.    Female chaperone present for pelvic and breast  portions of the physical exam  Results: AUDIT Questionnaire (screen for alcoholism): 3 PHQ-9: 3   Assessment: 27 y.o. G0P0000 female here for routine annual gynecologic examination  Plan: Problem List Items Addressed This Visit    None    Visit Diagnoses    Women's annual routine gynecological examination    -  Primary   Relevant Orders   GC/Chlamydia Probe Amp   Cytology - PAP   Screening for depression       Screening for alcoholism       Pap smear for cervical  cancer screening       Relevant Orders   Cytology - PAP   Screen for STD (sexually transmitted disease)       Relevant Orders   GC/Chlamydia Probe Amp      Screening: -- Blood pressure screen normal -- Weight screening: normal -- Depression screening negative (PHQ-9) -- Nutrition: normal -- cholesterol screening: not due for screening -- osteoporosis screening: not due -- tobacco screening: not using -- alcohol screening: AUDIT questionnaire indicates low-risk usage. -- family history of breast cancer screening: done. not at high risk. -- no evidence of domestic violence or intimate partner violence. -- STD  screening: gonorrhea/chlamydia NAAT collected -- pap smear collected per ASCCP guidelines -- flu vaccine will get elsewhere -- HPV vaccination series: unsure. Since she is attempting pregnancy I recommend she wait to start the vaccine series.    Thomasene Mohair, MD 05/22/2018 8:44 AM

## 2018-05-24 LAB — GC/CHLAMYDIA PROBE AMP
Chlamydia trachomatis, NAA: NEGATIVE
Neisseria gonorrhoeae by PCR: NEGATIVE

## 2018-05-24 LAB — CYTOLOGY - PAP: DIAGNOSIS: NEGATIVE

## 2018-06-04 DIAGNOSIS — Z1159 Encounter for screening for other viral diseases: Secondary | ICD-10-CM | POA: Diagnosis not present

## 2018-06-04 DIAGNOSIS — Z1329 Encounter for screening for other suspected endocrine disorder: Secondary | ICD-10-CM | POA: Diagnosis not present

## 2018-06-04 DIAGNOSIS — N979 Female infertility, unspecified: Secondary | ICD-10-CM | POA: Diagnosis not present

## 2018-06-04 DIAGNOSIS — Z01812 Encounter for preprocedural laboratory examination: Secondary | ICD-10-CM | POA: Diagnosis not present

## 2018-06-04 DIAGNOSIS — E559 Vitamin D deficiency, unspecified: Secondary | ICD-10-CM | POA: Diagnosis not present

## 2018-07-05 DIAGNOSIS — N840 Polyp of corpus uteri: Secondary | ICD-10-CM | POA: Diagnosis not present

## 2019-04-04 ENCOUNTER — Encounter: Payer: BLUE CROSS/BLUE SHIELD | Admitting: Maternal Newborn

## 2019-04-12 ENCOUNTER — Ambulatory Visit
Admission: EM | Admit: 2019-04-12 | Discharge: 2019-04-12 | Disposition: A | Discharge status: Home / Self Care | Payer: Managed Care, Other (non HMO) | Attending: Family Medicine | Admitting: Family Medicine

## 2019-04-12 ENCOUNTER — Other Ambulatory Visit: Payer: Self-pay

## 2019-04-12 ENCOUNTER — Encounter: Payer: Self-pay | Admitting: Emergency Medicine

## 2019-04-12 DIAGNOSIS — R112 Nausea with vomiting, unspecified: Secondary | ICD-10-CM

## 2019-04-12 DIAGNOSIS — Z3A01 Less than 8 weeks gestation of pregnancy: Secondary | ICD-10-CM

## 2019-04-12 NOTE — ED Triage Notes (Signed)
Patient c/o N/V that started a week ago and is mostly happens in the morning.  Patient states that she is [redacted] weeks pregnant.  Patient states that she had under gone IVF and is still doing the progesterone injections.

## 2019-04-12 NOTE — Discharge Instructions (Signed)
Over the counter: Doxylamine (Unisom) WITHOUT BENADRYL: 25mg  take one half tablet with  Vitamin B6 daily Follow up with OB/GYN specialist Go to Emergency Department if symptoms worsen

## 2019-04-12 NOTE — ED Provider Notes (Signed)
[facility]    CSN: [id number] Arrival date & time: 04/12/19  1022      History   Chief Complaint Chief Complaint  Patient presents with  . Nausea  . Emesis    HPI Maureen Black is a 28 y.o. female.   28 yo female with a c/o nausea and vomiting for the past week, mostly in the morning. States she's [redacted] weeks pregnant. Has tried over the counter ginger products without relief. Denies any fevers or  hematemesis.   Emesis   Past Medical History:  Diagnosis Date  . Abnormal Pap smear of cervix 2012  . Constipation     There are no active problems to display for this patient.   Past Surgical History:  Procedure Laterality Date  . DG SELECTED HSG [facility] ONLY    . HYSTEROSALPINGOGRAM      OB History    Gravida  1   Para  0   Term  0   Preterm  0   AB  0   Living  0     SAB  0   TAB  0   Ectopic  0   Multiple  0   Live Births  0            Home Medications    Prior to Admission medications   Medication Sig Start Date End Date Taking? Authorizing Provider  PROGESTERONE IM Inject into the muscle.   Yes [provider]    Family History Family History  Problem Relation Age of Onset  . Hypertension Mother   . Diabetes Maternal Grandmother     Social History Social History   Tobacco Use  . Smoking status: Never Smoker  . Smokeless tobacco: Never Used  Substance Use Topics  . Alcohol use: Yes    Comment: socially  . Drug use: No     Allergies   Patient has no known allergies.   Review of Systems Review of Systems  Gastrointestinal: Positive for vomiting.     Physical Exam Triage Vital Signs ED Triage Vitals  Enc Vitals Group     BP 04/12/19 1043 117/69     Pulse Rate 04/12/19 1043 95     Resp 04/12/19 1043 14     Temp 04/12/19 1043 98.8 F (37.1 C)     Temp Source 04/12/19 1043 Oral     SpO2 04/12/19 1043 100 %     Weight 04/12/19 1039 130 lb (59 kg)     Height 04/12/19 1039 5\' 4"   (1.626 m)     Head Circumference --      Peak Flow --      Pain Score 04/12/19 1039 0     Pain Loc --      Pain Edu? --      Excl. in GC? --    No data found.  Updated Vital Signs BP 117/69 (BP Location: Left Arm)   Pulse 95   Temp 98.8 F (37.1 C) (Oral)   Resp 14   Ht 5\' 4"  (1.626 m)   Wt 59 kg   LMP 05/07/2018   SpO2 100%   BMI 22.31 kg/m   Visual Acuity Right Eye Distance:   Left Eye Distance:   Bilateral Distance:    Right Eye Near:   Left Eye Near:    Bilateral Near:     Physical Exam Vitals signs and nursing note reviewed.  Constitutional:      General: She is not  in acute distress.    Appearance: She is not toxic-appearing or diaphoretic.  Abdominal:     General: There is no distension.  Neurological:     Mental Status: She is alert.      UC Treatments / Results  Labs (all labs ordered are listed, but only abnormal results are displayed) Labs Reviewed - No data to display  EKG   Radiology No results found.  Procedures Procedures (including critical care time)  Medications Ordered in UC Medications - No data to display  Initial Impression / Assessment and Plan / UC Course  I have reviewed the triage vital signs and the nursing notes.  Pertinent labs & imaging results that were available during my care of the patient were reviewed by me and considered in my medical decision making (see chart for details).      Final Clinical Impressions(s) / UC Diagnoses   Final diagnoses:  Nausea and vomiting, intractability of vomiting not specified, unspecified vomiting type  Less than [redacted] weeks gestation of pregnancy     Discharge Instructions     Over the counter: Doxylamine (Unisom) WITHOUT BENADRYL: 25mg  take one half tablet with  Vitamin B6 daily Follow up with OB/GYN specialist Go to Emergency Department if symptoms worsen    ED Prescriptions    None      1. diagnosis reviewed with patient 2. Recommend supportive treatment as  above per discharge instructions 3. Go to Emergency Department if symptoms worsen  4. Follow-up prn  PDMP not reviewed this encounter.   Norval Gable, MD 04/12/19 1705

## 2019-05-01 ENCOUNTER — Other Ambulatory Visit: Payer: Self-pay

## 2019-05-01 ENCOUNTER — Other Ambulatory Visit (HOSPITAL_COMMUNITY)
Admission: RE | Admit: 2019-05-01 | Discharge: 2019-05-01 | Disposition: A | Discharge status: Home / Self Care | Payer: Managed Care, Other (non HMO) | Source: Ambulatory Visit | Attending: Obstetrics and Gynecology | Admitting: Obstetrics and Gynecology

## 2019-05-01 ENCOUNTER — Encounter: Payer: Self-pay | Admitting: Obstetrics and Gynecology

## 2019-05-01 ENCOUNTER — Ambulatory Visit (INDEPENDENT_AMBULATORY_CARE_PROVIDER_SITE_OTHER): Payer: Managed Care, Other (non HMO) | Admitting: Obstetrics and Gynecology

## 2019-05-01 VITALS — BP 104/64 | Wt 126.0 lb

## 2019-05-01 DIAGNOSIS — O09811 Supervision of pregnancy resulting from assisted reproductive technology, first trimester: Secondary | ICD-10-CM

## 2019-05-01 DIAGNOSIS — Z3A09 9 weeks gestation of pregnancy: Secondary | ICD-10-CM

## 2019-05-01 DIAGNOSIS — O0990 Supervision of high risk pregnancy, unspecified, unspecified trimester: Secondary | ICD-10-CM

## 2019-05-01 DIAGNOSIS — O09819 Supervision of pregnancy resulting from assisted reproductive technology, unspecified trimester: Secondary | ICD-10-CM

## 2019-05-01 DIAGNOSIS — O0991 Supervision of high risk pregnancy, unspecified, first trimester: Secondary | ICD-10-CM

## 2019-05-01 HISTORY — DX: Supervision of high risk pregnancy, unspecified, unspecified trimester: O09.90

## 2019-05-01 HISTORY — DX: Supervision of pregnancy resulting from assisted reproductive technology, unspecified trimester: O09.819

## 2019-05-01 NOTE — Progress Notes (Signed)
New Obstetric Patient H&P   Chief Complaint: "Desires prenatal care"   History of Present Illness: Patient is a 28 y.o. G1P0000 Not Hispanic or Latino female, Based on her  Embryo transfer date, her EDD is Estimated Date of Delivery: 12/01/19 and her EGA is 49w3dHer last pap smear was 1 year ago and was no abnormalities.    Since the beginning of her pregnancy she claims she has experienced some morning sickness. She is taking Bonjesta for this.  She skipped some days last week and she did get sick for a couple of days.  She is still learning what to eat to help.  She is taking only the night-time dose. She denies vaginal bleeding. She has had some brown-like discharge when she wiped.  She doesn't notice it on her underwear.  She was taking a progesterone shot, but she stopped taking it.  This week would have been her last week taking it.  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  Her husband has tested negative as a carrier. She has no prior pregnancies.   Since her LMP, she admits to the use of tobacco products  no She claims she has lost four pounds since the start of her pregnancy.  There are cats in the home in the home  no  She admits close contact with children on a regular basis  no  She has had chicken pox in the past yes She has had Tuberculosis exposures, symptoms, or previously tested positive for TB   no Current or past history of domestic violence. no  Genetic Screening/Teratology Counseling: (Includes patient, baby's father, or anyone in either family with:)   21. Patient's age >/= 30 at [facility]  no 2. Thalassemia ([place], [place], [place], or Asian background): MCV<80.  Yes, see HPI 3. Neural tube defect (meningomyelocele, spina bifida, anencephaly)  no 4. Congenital heart defect  no  5. Down syndrome  no 6. Tay-Sachs (Jewish, [place])  no 7. Canavan's Disease  no 8. Sickle cell disease or trait (African)  no  9. Hemophilia or  other blood disorders  no  10. Muscular dystrophy  no  11. Cystic fibrosis  no  12. Huntington's Chorea  no  13. Mental retardation/autism  no 14. Other inherited genetic or chromosomal disorder  no 15. Maternal metabolic disorder (DM, PKU, etc)  no 16. Patient or FOB with a child with a birth defect not listed above no  16a. Patient or FOB with a birth defect themselves no 17. Recurrent pregnancy loss, or stillbirth  no  18. Any medications since LMP other than prenatal vitamins (include vitamins, supplements, OTC meds, drugs, alcohol)  Bonjesta, PNV, Progesterone injections (daily, has stopped at this point) 19. Any other genetic/environmental exposure to discuss  no  Infection History:   1. Lives with someone with TB or TB exposed  no  2. Patient or partner has history of genital herpes  no 3. Rash or viral illness since LMP  no 4. History of STI (GC, CT, HPV, syphilis, HIV)  Distant history of chlamydia (she is unsure what she had) 5. History of recent travel :  no  Other pertinent information:  This is an IVF pregnancy.     Review of Systems:10 point review of systems negative unless otherwise noted in HPI  Past Medical History:  Diagnosis Date  . Abnormal Pap smear of cervix 2012  . Alpha thalassemia silent carrier   . Constipation     Past Surgical  History:  Procedure Laterality Date  . DG SELECTED HSG [facility] ONLY    . HYSTEROSALPINGOGRAM      Gynecologic History: No LMP recorded. Patient is pregnant.  Obstetric History: G1P0000  Family History  Problem Relation Age of Onset  . Hypertension Mother   . Diabetes Maternal Grandmother   . Lung cancer Paternal Grandmother     Social History   Socioeconomic History  . Marital status: Married    Spouse name: Armed forces logistics/support/administrative officerTyrone Bricco  . Number of children: Not on file  . Years of education: Not on file  . Highest education level: Not on file  Occupational History  . Not on file  Social Needs  . Financial resource  strain: Not on file  . Food insecurity    Worry: Not on file    Inability: Not on file  . Transportation needs    Medical: Not on file    Non-medical: Not on file  Tobacco Use  . Smoking status: Never Smoker  . Smokeless tobacco: Never Used  Substance and Sexual Activity  . Alcohol use: Not Currently    Comment: socially  . Drug use: No  . Sexual activity: Yes    Partners: Male    Birth control/protection: None  Lifestyle  . Physical activity    Days per week: Not on file    Minutes per session: Not on file  . Stress: Not on file  Relationships  . Social Musicianconnections    Talks on phone: Not on file    Gets together: Not on file    Attends religious service: Not on file    Active member of club or organization: Not on file    Attends meetings of clubs or organizations: Not on file    Relationship status: Not on file  . Intimate partner violence    Fear of current or ex partner: Not on file    Emotionally abused: Not on file    Physically abused: Not on file    Forced sexual activity: Not on file  Other Topics Concern  . Not on file  Social History Narrative  . Not on file    No Known Allergies  Prior to Admission medications   Medication Sig Start Date End Date Taking? Authorizing Provider  BONJESTA 20-20 MG TBCR  04/19/19  Yes [provider]    Physical Exam BP 104/64   Wt 126 lb (57.2 kg)   BMI 21.63 kg/m   Physical Exam Vitals signs reviewed. Exam conducted with a chaperone present.  Constitutional:      General: She is not in acute distress.    Appearance: Normal appearance. She is well-developed.  HENT:     Head: Normocephalic and atraumatic.  Eyes:     General: No scleral icterus.    Conjunctiva/sclera: Conjunctivae normal.  Neck:     Musculoskeletal: Normal range of motion and neck supple.     Thyroid: No thyromegaly.  Cardiovascular:     Rate and Rhythm: Normal rate and regular rhythm.     Heart sounds: Normal heart sounds. No murmur.  No friction rub. No gallop.   Pulmonary:     Effort: Pulmonary effort is normal.     Breath sounds: Normal breath sounds. No wheezing, rhonchi or rales.  Abdominal:     General: Bowel sounds are normal. There is no distension.     Palpations: Abdomen is soft. There is no mass.     Tenderness: There is no abdominal tenderness. There is  no guarding or rebound.     Hernia: No hernia is present. There is no hernia in the left inguinal area.  Genitourinary:    General: Normal vulva.     Exam position: Lithotomy position.     Tanner stage (genital): 5.     Labia:        Right: No rash, tenderness or lesion.        Left: No rash, tenderness or lesion.      Vagina: Normal.     Cervix: Normal.     Uterus: Enlarged.      Adnexa: Right adnexa normal and left adnexa normal.  Musculoskeletal: Normal range of motion.  Skin:    General: Skin is warm and dry.     Findings: No rash.  Neurological:     General: No focal deficit present.     Mental Status: She is alert and oriented to person, place, and time.     Cranial Nerves: No cranial nerve deficit.  Psychiatric:        Mood and Affect: Mood normal.        Behavior: Behavior normal.        Judgment: Judgment normal.      Female Chaperone present during breast and/or pelvic exam.  Bedside u/s performed by me: Single, living intrauterine pregnancy with CRL 2.62 cm, consistent with GA [redacted]w[redacted]d. +cardiac activity = 182 bpm.   No abnormalities noted, but limited by early gestation.  Assessment: 28 y.o. G1P0000 at [redacted]w[redacted]d presenting to initiate prenatal care  Plan: 1) Avoid alcoholic beverages. 2) Patient encouraged not to smoke.  3) Discontinue the use of all non-medicinal drugs and chemicals.  4) Take prenatal vitamins daily.  5) Nutrition, food safety (fish, cheese advisories, and high nitrite foods) and exercise discussed. 6) Hospital and practice style discussed with cross coverage system.  7) Genetic Screening, such as with 1st  Trimester Screening, cell free fetal DNA, AFP testing, and Ultrasound, as well as with amniocentesis and CVS as appropriate, is discussed with patient. At the conclusion of today's visit patient requested genetic testing 8) Patient is asked about travel to areas at risk for the Zika virus, and counseled to avoid travel and exposure to mosquitoes or sexual partners who may have themselves been exposed to the virus. Testing is discussed, and will be ordered as appropriate.  9) Labs from [facility] reviewed.  She had carrier screen testing for SMA, Fragile X, Cystic Fibrosis and a host of others in a panel test.  All were negative except for her being a silent carrier for alpha thalassemia.  She states her husband was tested and he was negative for any alpha thalassemia mutations.  Also, she is rubella immune and O+ per outside lab review.   10) Discussed need for ECHO at 22 weeks.   Thomasene Mohair, MD 05/01/2019 9:22 AM

## 2019-05-03 LAB — URINE DRUG PANEL 7
Amphetamines, Urine: NEGATIVE ng/mL
Barbiturate Quant, Ur: NEGATIVE ng/mL
Benzodiazepine Quant, Ur: NEGATIVE ng/mL
Cannabinoid Quant, Ur: NEGATIVE ng/mL
Cocaine (Metab.): NEGATIVE ng/mL
Opiate Quant, Ur: NEGATIVE ng/mL
PCP Quant, Ur: NEGATIVE ng/mL

## 2019-05-03 LAB — URINE CULTURE: Organism ID, Bacteria: NO GROWTH

## 2019-05-07 LAB — CERVICOVAGINAL ANCILLARY ONLY
Chlamydia: NEGATIVE
Comment: NEGATIVE
Comment: NORMAL
Neisseria Gonorrhea: NEGATIVE

## 2019-05-16 ENCOUNTER — Other Ambulatory Visit: Payer: Self-pay

## 2019-05-16 ENCOUNTER — Ambulatory Visit (INDEPENDENT_AMBULATORY_CARE_PROVIDER_SITE_OTHER): Payer: Managed Care, Other (non HMO) | Admitting: Certified Nurse Midwife

## 2019-05-16 VITALS — BP 110/70 | Wt 125.0 lb

## 2019-05-16 DIAGNOSIS — Z3A11 11 weeks gestation of pregnancy: Secondary | ICD-10-CM

## 2019-05-16 DIAGNOSIS — O200 Threatened abortion: Secondary | ICD-10-CM

## 2019-05-16 NOTE — Progress Notes (Signed)
C/o ED f/u; bleeding this am - light spotting since. rj

## 2019-05-16 NOTE — Telephone Encounter (Signed)
Patient called to be seen.  Spoke with CLG about bring patient in for an appointment. Per CLG schedule telephone visit to speak with patient. Patient is schedule for 2 pm today. Patient feels like we don't care for her. And she is really concerned about her bleeding that she still has. I assured patient if she needed to be seen in Office she would be seen but that the provider feels she can due visit via phone. Would you please contact patient to follow up with her prior to her schedule telephone visit.

## 2019-05-17 NOTE — Progress Notes (Signed)
Work in for vaginal bleeding at 11wk4d: Seen yesterday at [facility] for vaginal bleeding. Currently bleeding is light. No cramping. [place] yesterday revealed a viable pregnancy with +FCA and a small [place] Blood type O POS. This is an IVF pregnancy. Was on progesterone after IVF until 7+ weeks (stopped a little early when she ran out of medication) Is very anxious regarding threatened miscarriage  FHT 161 today  A: Threatened abortion at 11wk4d  P: Talked with patient regarding diagnosis and need for watchful waiting. Encouraged by +FCA Follow up as scheduled or sooner for increased bleeding  Dalia Heading, CNM

## 2019-05-26 ENCOUNTER — Other Ambulatory Visit: Payer: Self-pay

## 2019-05-26 ENCOUNTER — Ambulatory Visit (INDEPENDENT_AMBULATORY_CARE_PROVIDER_SITE_OTHER): Payer: Managed Care, Other (non HMO) | Admitting: Obstetrics and Gynecology

## 2019-05-26 ENCOUNTER — Ambulatory Visit (INDEPENDENT_AMBULATORY_CARE_PROVIDER_SITE_OTHER): Payer: Managed Care, Other (non HMO)

## 2019-05-26 VITALS — BP 118/74 | Wt 125.0 lb

## 2019-05-26 DIAGNOSIS — Z3A13 13 weeks gestation of pregnancy: Secondary | ICD-10-CM

## 2019-05-26 DIAGNOSIS — O0990 Supervision of high risk pregnancy, unspecified, unspecified trimester: Secondary | ICD-10-CM

## 2019-05-26 DIAGNOSIS — O0991 Supervision of high risk pregnancy, unspecified, first trimester: Secondary | ICD-10-CM

## 2019-05-26 DIAGNOSIS — O09811 Supervision of pregnancy resulting from assisted reproductive technology, first trimester: Secondary | ICD-10-CM

## 2019-05-26 DIAGNOSIS — Z3682 Encounter for antenatal screening for nuchal translucency: Secondary | ICD-10-CM

## 2019-05-26 NOTE — Progress Notes (Signed)
Routine Prenatal Care Visit  Subjective  Maureen Black is a 28 y.o. G1P0000 at [redacted]w[redacted]d being seen today for ongoing prenatal care.  She is currently monitored for the following issues for this high-risk pregnancy and has Supervision of high risk pregnancy, antepartum and Pregnancy conceived through in vitro fertilization on their problem list.  ----------------------------------------------------------------------------------- Patient reports no complaints.    . Vag. Bleeding: None.   . Leaking Fluid denies.  U/S today shows resolution of [facility]. ----------------------------------------------------------------------------------- The following portions of the patient's history were reviewed and updated as appropriate: allergies, current medications, past family history, past medical history, past social history, past surgical history and problem list. Problem list updated.  Objective  Blood pressure 118/74, weight 125 lb (56.7 kg). Pregravid weight 130 lb (59 kg) Total Weight Gain -5 lb (-2.268 kg) Urinalysis: Urine Protein    Urine Glucose    Fetal Status: Fetal Heart Rate (bpm): 154         General:  Alert, oriented and cooperative. Patient is in no acute distress.  Skin: Skin is warm and dry. No rash noted.   Cardiovascular: Normal heart rate noted  Respiratory: Normal respiratory effort, no problems with respiration noted  Abdomen: Soft, gravid, appropriate for gestational age.       Pelvic:  Cervical exam deferred        Extremities: Normal range of motion.     Mental Status: Normal mood and affect. Normal behavior. Normal judgment and thought content.   Imaging Results US Fetal Nuchal Translucency Measurement  Result Date: 05/26/2019 Patient Name: Brentley Horrell DOB: Oct 05, 1990 MRN: [id number] ULTRASOUND REPORT Location: [facility] Date of Service: 05/26/2019 Indications:First Trimester Screen - NT Findings: Singleton intrauterine pregnancy is visualized with  a CRL consistent with [redacted]w[redacted]d gestation, giving an (U/S) EDD of 12/04/2019. The (U/S) EDD is consistent with the clinically established Estimated Date of Delivery: 12/01/19. FHR: 154 BPM CRL measurement: 60.6 mm NT measurement: 2.1 mm. Yolk sac and and early anatomy is not normal. Amnion is not visualized. Nasal Bone is Present Right Ovary is normal in appearance. Left Ovary is normal in appearance. Survey of the adnexa demonstrates no adnexal masses. There is no free peritoneal fluid in the cul de sac. Impression: 1. [redacted]w[redacted]d viable Singleton Intrauterine pregnancy by U/S. 2. (U/S) EDD is consistent with Clinically established Estimated Date of Delivery: 12/01/19. 3. NT Screen is successfully completed. Gweneth Dimitri, RT There is a viable  singleton gestation.  The fetal biometry correlates with established dating Detailed evaluation of the fetal anatomy is precluded by early gestational age.The NT measurement is less than 25mm.    It must be noted that a normal ultrasound is unable to definitively rule out fetal aneuploidy.  Prentice Docker, MD, [facility], [facility] 05/26/2019, 4:49 PM.     Assessment   28 y.o. G1P0000 at [redacted]w[redacted]d by  12/01/2019, Date entered prior to episode creation presenting for routine prenatal visit  Plan   FIRST Problems (from 05/01/19 to present)    Problem Noted Resolved   Supervision of high risk pregnancy, antepartum 05/01/2019 by Will Bonnet, MD No   Overview Signed 05/01/2019  1:36 PM by Will Bonnet, MD    Clinic [facility] Prenatal Labs  Dating Embryo transfer Blood type: O+  Genetic Screen 1 Screen:    AFP:     Quad:     NIPS: Antibody: Negative  Anatomic [place]  Rubella: Immune Varicella: [ ]  needs  GTT Early:  Third trimester:  RPR: NR  Rhogam n/a HBsAg: neg  TDaP vaccine                       Flu Shot: HIV: negative  Baby Food                                GBS:   Contraception  Pap: NILM 1 year ago (05/2018)  CBB      CS/VBAC    Support Person Delila Pereyra - husband           Pregnancy conceived through in vitro fertilization 05/01/2019 by Conard Novak, MD No   Overview Signed 05/01/2019  1:37 PM by Conard Novak, MD    [ ]  ECHO at 22 weeks          Preterm labor symptoms and general obstetric precautions including but not limited to vaginal bleeding, contractions, leaking of fluid and fetal movement were reviewed in detail with the patient. Please refer to After Visit Summary for other counseling recommendations.   Patient would like NIPT. Will schedule between now and her next visit. We discussed in detail the difference between NIPT and a first trimester screen.  No other issues today. Nausea reasonably well controlled on Bonjesta.    Return in about 4 weeks (around 06/23/2019) for Routine Prenatal Appointment.  14/01/2019, MD, [facility], [facility] 05/27/2019 3:15 PM

## 2019-05-27 ENCOUNTER — Encounter: Payer: Self-pay | Admitting: Obstetrics and Gynecology

## 2019-06-02 ENCOUNTER — Telehealth: Payer: Self-pay

## 2019-06-02 NOTE — Telephone Encounter (Signed)
Please add pt to lab schedule on Wednesday at 10:00 for genetic testing.

## 2019-06-04 ENCOUNTER — Other Ambulatory Visit: Payer: Self-pay | Admitting: Obstetrics and Gynecology

## 2019-06-04 ENCOUNTER — Other Ambulatory Visit: Payer: Managed Care, Other (non HMO)

## 2019-06-04 ENCOUNTER — Other Ambulatory Visit: Payer: Self-pay

## 2019-06-04 DIAGNOSIS — Z3A14 14 weeks gestation of pregnancy: Secondary | ICD-10-CM

## 2019-06-04 DIAGNOSIS — O09812 Supervision of pregnancy resulting from assisted reproductive technology, second trimester: Secondary | ICD-10-CM

## 2019-06-04 DIAGNOSIS — O0990 Supervision of high risk pregnancy, unspecified, unspecified trimester: Secondary | ICD-10-CM

## 2019-06-04 DIAGNOSIS — Z1379 Encounter for other screening for genetic and chromosomal anomalies: Secondary | ICD-10-CM

## 2019-06-09 LAB — MATERNIT 21 PLUS CORE, BLOOD
Fetal Fraction: 8
Result (T21): NEGATIVE
Trisomy 13 (Patau syndrome): NEGATIVE
Trisomy 18 (Edwards syndrome): NEGATIVE
Trisomy 21 (Down syndrome): NEGATIVE

## 2019-06-09 LAB — SPECIMEN STATUS REPORT

## 2019-06-23 ENCOUNTER — Encounter: Payer: Self-pay | Admitting: Advanced Practice Midwife

## 2019-06-23 ENCOUNTER — Other Ambulatory Visit: Payer: Self-pay

## 2019-06-23 ENCOUNTER — Ambulatory Visit (INDEPENDENT_AMBULATORY_CARE_PROVIDER_SITE_OTHER): Payer: Managed Care, Other (non HMO) | Admitting: Advanced Practice Midwife

## 2019-06-23 VITALS — BP 110/60 | Wt 126.0 lb

## 2019-06-23 DIAGNOSIS — O0990 Supervision of high risk pregnancy, unspecified, unspecified trimester: Secondary | ICD-10-CM

## 2019-06-23 DIAGNOSIS — Z3A17 17 weeks gestation of pregnancy: Secondary | ICD-10-CM

## 2019-06-23 DIAGNOSIS — O0992 Supervision of high risk pregnancy, unspecified, second trimester: Secondary | ICD-10-CM

## 2019-06-23 NOTE — Progress Notes (Signed)
Routine Prenatal Care Visit  Subjective  Maureen Black is a 28 y.o. G1P0000 at [redacted]w[redacted]d being seen today for ongoing prenatal care.  She is currently monitored for the following issues for this high-risk pregnancy and has Supervision of high risk pregnancy, antepartum and Pregnancy conceived through in vitro fertilization on their problem list.  ----------------------------------------------------------------------------------- Patient reports continued nausea with vomiting some days. She is able to keep some things down and is able to drink water.    . Vag. Bleeding: None.  Movement: Present. Leaking Fluid denies.  ----------------------------------------------------------------------------------- The following portions of the patient's history were reviewed and updated as appropriate: allergies, current medications, past family history, past medical history, past social history, past surgical history and problem list. Problem list updated.  Objective  Blood pressure 110/60, weight 126 lb (57.2 kg). Pregravid weight 130 lb (59 kg) Total Weight Gain -4 lb (-1.814 kg) Urinalysis: Urine Protein    Urine Glucose    Fetal Status: Fetal Heart Rate (bpm): 145   Movement: Present     General:  Alert, oriented and cooperative. Patient is in no acute distress.  Skin: Skin is warm and dry. No rash noted.   Cardiovascular: Normal heart rate noted  Respiratory: Normal respiratory effort, no problems with respiration noted  Abdomen: Soft, gravid, appropriate for gestational age. Pain/Pressure: Present     Pelvic:  Cervical exam deferred        Extremities: Normal range of motion.  Edema: None  Mental Status: Normal mood and affect. Normal behavior. Normal judgment and thought content.   Assessment   28 y.o. G1P0000 at [redacted]w[redacted]d by  12/01/2019, Date entered prior to episode creation presenting for routine prenatal visit  Plan   FIRST Problems (from 05/01/19 to present)    Problem Noted  Resolved   Supervision of high risk pregnancy, antepartum 05/01/2019 by Conard Novak, MD No   Overview Signed 05/01/2019  1:36 PM by Conard Novak, MD    Clinic [facility] Prenatal Labs  Dating Embryo transfer Blood type: O+  Genetic Screen 1 Screen:    AFP:     Quad:     NIPS: Antibody: Negative  Anatomic [place]  Rubella: Immune Varicella: [ ]  needs  GTT Early:               Third trimester:  RPR: NR  Rhogam n/a HBsAg: neg  TDaP vaccine                       Flu Shot: HIV: negative  Baby Food                                GBS:   Contraception  Pap: NILM 1 year ago (05/2018)  CBB     CS/VBAC    Support Person 06/2018 - husband           Pregnancy conceived through in vitro fertilization 05/01/2019 by 05/03/2019, MD No   Overview Signed 05/01/2019  1:37 PM by 05/03/2019, MD    [ ]  ECHO at 22 weeks          Preterm labor symptoms and general obstetric precautions including but not limited to vaginal bleeding, contractions, leaking of fluid and fetal movement were reviewed in detail with the patient. N/V: small frequent meals, stay hydrated, ginger, [employer], Bonjesta PRN  Return in about 3 weeks (around 07/14/2019) for anatomy scan  and rob.  Rod Can, CNM 06/23/2019 9:37 AM

## 2019-07-02 DIAGNOSIS — Z0289 Encounter for other administrative examinations: Secondary | ICD-10-CM

## 2019-07-03 ENCOUNTER — Telehealth: Payer: Self-pay

## 2019-07-03 NOTE — Telephone Encounter (Signed)
Pt needs a call about her FMLA papers, needs to know if they can be submitted before 36 weeks, that way her job will everything will be ready. Please advise

## 2019-07-03 NOTE — Telephone Encounter (Signed)
FMLA/DISABILITY form for [employer] filled out, signature obtained, and given to KT for processing.

## 2019-07-15 ENCOUNTER — Ambulatory Visit (INDEPENDENT_AMBULATORY_CARE_PROVIDER_SITE_OTHER): Payer: Managed Care, Other (non HMO) | Admitting: Obstetrics and Gynecology

## 2019-07-15 ENCOUNTER — Other Ambulatory Visit: Payer: Self-pay

## 2019-07-15 ENCOUNTER — Ambulatory Visit (INDEPENDENT_AMBULATORY_CARE_PROVIDER_SITE_OTHER): Payer: Managed Care, Other (non HMO)

## 2019-07-15 VITALS — BP 114/66 | Wt 126.0 lb

## 2019-07-15 DIAGNOSIS — O0992 Supervision of high risk pregnancy, unspecified, second trimester: Secondary | ICD-10-CM

## 2019-07-15 DIAGNOSIS — Z363 Encounter for antenatal screening for malformations: Secondary | ICD-10-CM | POA: Diagnosis not present

## 2019-07-15 DIAGNOSIS — Z3A20 20 weeks gestation of pregnancy: Secondary | ICD-10-CM

## 2019-07-15 DIAGNOSIS — O0990 Supervision of high risk pregnancy, unspecified, unspecified trimester: Secondary | ICD-10-CM

## 2019-07-15 DIAGNOSIS — O09812 Supervision of pregnancy resulting from assisted reproductive technology, second trimester: Secondary | ICD-10-CM

## 2019-07-15 LAB — POCT URINALYSIS DIPSTICK OB
Glucose, UA: NEGATIVE
POC,PROTEIN,UA: NEGATIVE

## 2019-07-15 NOTE — Progress Notes (Signed)
Routine Prenatal Care Visit  Subjective  Maureen Black is a 28 y.o. G1P0000 at [redacted]w[redacted]d being seen today for ongoing prenatal care.  She is currently monitored for the following issues for this low-risk pregnancy and has Supervision of high risk pregnancy, antepartum and Pregnancy conceived through in vitro fertilization on their problem list.  ----------------------------------------------------------------------------------- Patient reports no complaints.    .  .   . Denies leaking of fluid.  ----------------------------------------------------------------------------------- The following portions of the patient's history were reviewed and updated as appropriate: allergies, current medications, past family history, past medical history, past social history, past surgical history and problem list. Problem list updated.   Objective  Blood pressure 114/66, weight 126 lb (57.2 kg). Pregravid weight 130 lb (59 kg) Total Weight Gain -4 lb (-1.814 kg) Urinalysis:      Fetal Status:           General:  Alert, oriented and cooperative. Patient is in no acute distress.  Skin: Skin is warm and dry. No rash noted.   Cardiovascular: Normal heart rate noted  Respiratory: Normal respiratory effort, no problems with respiration noted  Abdomen: Soft, gravid, appropriate for gestational age.       Pelvic:  Cervical exam deferred        Extremities: Normal range of motion.     ental Status: Normal Black and affect. Normal behavior. Normal judgment and thought content.   US OB Comp + 14 Wk  Result Date: 07/16/2019 Patient Name: Maureen Black DOB: 1991-02-06 MRN: [id number] ULTRASOUND REPORT Location: [facility] Date of Service: 07/15/2019 Indications:Anatomy Ultrasound Findings: Nelda [place]se[place] intrauterine pregnancy is visualized with FHR at 153 BPM. Biometrics give an (U/S) Gestational age of [redacted]w[redacted]d and an (U/S) EDD of 12/02/2019; this correlates with the clinically established  Estimated Date of Delivery: 12/01/19 Fetal presentation is Cephalic. EFW: 332 g ( 12 oz ). Placenta: anterior. Grade: 0 AFI: subjectively normal. Anatomic survey is complete and normal; Gender - female.  Impression: 1. [redacted]w[redacted]d Viable Singleton Intrauterine pregnancy by U/S. 2. (U/S) EDD is consistent with Clinically established Estimated Date of Delivery: 12/01/19 . 3. Normal Anatomy Scan Recommendations: 1.Clinical correlation with the patient's History and Physical Exam. Maureen Black, RT  There is a singleton gestation with subjectively normal amniotic fluid volume. The fetal biometry correlates with established dating. Detailed evaluation of the fetal anatomy was performed.The fetal anatomical survey appears within normal limits within the resolution of ultrasound as described above.  It must be noted that a normal ultrasound is unable to rule out fetal aneuploidy, subtle defects such as small ASD or VDS may also not be visible on imaging.  Maureen Mood, MD, [facility], [facility]     Assessment   28 y.o. G1P0000 at [redacted]w[redacted]d by  12/01/2019, Date entered prior to episode creation presenting for routine prenatal visit  Plan   FIRST Problems (from 05/01/19 to present)    Problem Noted Resolved   Supervision of high risk pregnancy, antepartum 05/01/2019 by Will Bonnet, MD No   Overview Signed 05/01/2019  1:36 PM by Will Bonnet, MD    Clinic [facility] Prenatal Labs  Dating Embryo transfer Blood type: O+  Genetic Screen 1 Screen:    AFP:     Quad:     NIPS: Antibody: Negative  Anatomic [place]  Rubella: Immune Varicella: [ ]  needs  GTT Early:               Third trimester:  RPR: NR  Rhogam n/a HBsAg: neg  TDaP vaccine                       Flu Shot: HIV: negative  Baby Food                                GBS:   Contraception  Pap: NILM 1 year ago (05/2018)  CBB     CS/VBAC    Support Person Delila Pereyra - husband           Pregnancy conceived through in vitro  fertilization 05/01/2019 by Conard Novak, MD No   Overview Signed 05/01/2019  1:37 PM by Conard Novak, MD    [ ]  ECHO at [redacted] weeks          Gestational age appropriate obstetric precautions including but not limited to vaginal bleeding, contractions, leaking of fluid and fetal movement were reviewed in detail with the patient.    - normal anatomy 21 - fetal echo scheduled as does not appear to be scheduled yet given IVF pregnancy  Return in about 4 weeks (around 08/12/2019) for ROB.  08/14/2019, MD, [facility], [facility] 07/15/2019, 3:54 PM

## 2019-07-15 NOTE — Progress Notes (Signed)
ROB  °Anatomy scan °

## 2019-07-18 NOTE — L&D Delivery Note (Signed)
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 Cord was clamped and cut on the perineum as the cord was not reducible. Baby dried and placed on mother's abdomen. Good cry after tactile stimulation. Baby then placed skin to skin with mother. Apgars 8&9. Spontaneous delivery of intact placenta and 3 vessel cord, followed by atony of lower uterine segment. Methergine 0.2 mgm IM and IV Pitocin as well as evacuation of clots from the lower uterine segment and fundal massage resolved atony. Small second degree perineal laceration repaired with 3-0 Chromic and 2-0 Chromic and bilateral labial lacerations repaired with 3-0 Chromic. [id number] ml Anesthesia:    epidural Intrapartum complications: Nuchal cord x 2 GBS:    Negative Laceration:    Second degree perineal and bilateral labial lacerations Episiotomy:    none Placenta:    Via active 3rd stage. To pathology: no Estimated Blood Loss:  200 ml Baby:    Liveborn female, Apgars 8/9, weight 6#4oz    Farrel Conners, CNM

## 2019-08-12 ENCOUNTER — Other Ambulatory Visit: Payer: Self-pay

## 2019-08-12 ENCOUNTER — Ambulatory Visit (INDEPENDENT_AMBULATORY_CARE_PROVIDER_SITE_OTHER): Payer: Managed Care, Other (non HMO) | Admitting: Obstetrics & Gynecology

## 2019-08-12 ENCOUNTER — Encounter: Payer: Self-pay | Admitting: Obstetrics & Gynecology

## 2019-08-12 VITALS — BP 120/80 | Wt 129.0 lb

## 2019-08-12 DIAGNOSIS — Z131 Encounter for screening for diabetes mellitus: Secondary | ICD-10-CM

## 2019-08-12 DIAGNOSIS — Z3A24 24 weeks gestation of pregnancy: Secondary | ICD-10-CM

## 2019-08-12 DIAGNOSIS — O0992 Supervision of high risk pregnancy, unspecified, second trimester: Secondary | ICD-10-CM

## 2019-08-12 DIAGNOSIS — O09812 Supervision of pregnancy resulting from assisted reproductive technology, second trimester: Secondary | ICD-10-CM

## 2019-08-12 DIAGNOSIS — O0990 Supervision of high risk pregnancy, unspecified, unspecified trimester: Secondary | ICD-10-CM

## 2019-08-12 LAB — POCT URINALYSIS DIPSTICK OB
Glucose, UA: NEGATIVE
POC,PROTEIN,UA: NEGATIVE

## 2019-08-12 MED ORDER — BONJESTA 20-20 MG PO TBCR: 1.0000 | EXTENDED_RELEASE_TABLET | Freq: Two times a day (BID) | ORAL | 6 refills | Status: DC

## 2019-08-12 NOTE — Progress Notes (Signed)
  Subjective  Fetal Movement? yes Contractions? no Leaking Fluid? no Vaginal Bleeding? no Some nausea still Objective  BP 120/80   Wt 129 lb (58.5 kg)   BMI 22.14 kg/m  General: NAD Pumonary: no increased work of breathing Abdomen: gravid, non-tender Extremities: no edema Psychiatric: mood appropriate, affect full  Assessment  29 y.o. G1P0000 at [redacted]w[redacted]d by  12/01/2019, Date entered prior to episode creation presenting for routine prenatal visit  Plan   Problem List Items Addressed This Visit      Other   Supervision of high risk pregnancy, antepartum   Pregnancy conceived through in vitro fertilization    Other Visit Diagnoses    [redacted] weeks gestation of pregnancy    -  Primary   Screening for diabetes mellitus       Relevant Orders   28 Week RH+Panel      FIRST Problems (from 05/01/19 to present)    Problem Noted Resolved   Supervision of high risk pregnancy, antepartum 05/01/2019 by Conard Novak, MD No   Overview Addendum 07/16/2019  8:52 PM by Vena Austria, MD    Clinic [facility] Prenatal Labs  Dating Embryo transfer Blood type: O+  Genetic Screen 1 Screen:    AFP:     Quad:     NIPS: Antibody: Negative  Anatomic [place] Normal Rubella: Immune Varicella: [ ]  needs  GTT Early:               Third trimester:  RPR: NR  Rhogam n/a HBsAg: neg  TDaP vaccine                       Flu Shot: HIV: negative  Baby Food                                GBS:   Contraception  Pap: NILM 1 year ago (05/2018)  CBB     CS/VBAC    Support Person 06/2018 - husband           Pregnancy conceived through in vitro fertilization 05/01/2019 by 05/03/2019, MD No   Overview Signed 05/01/2019  1:37 PM by 05/03/2019, MD    [x ] ECHO at 22 weeks        Echo reviewed, normal  Glucola nv  Conard Novak growth 30 weeks  GERD may be contributing to nausea, add Nexium     Cont Bonjesta as needed  [place], MD, [facility], [facility] 08/12/2019  10:18  AM

## 2019-08-12 NOTE — Patient Instructions (Signed)

## 2019-09-09 ENCOUNTER — Ambulatory Visit (INDEPENDENT_AMBULATORY_CARE_PROVIDER_SITE_OTHER): Payer: Managed Care, Other (non HMO) | Admitting: Obstetrics and Gynecology

## 2019-09-09 ENCOUNTER — Other Ambulatory Visit: Payer: Self-pay

## 2019-09-09 ENCOUNTER — Encounter: Payer: Self-pay | Admitting: Obstetrics and Gynecology

## 2019-09-09 ENCOUNTER — Other Ambulatory Visit: Payer: Managed Care, Other (non HMO)

## 2019-09-09 VITALS — BP 120/70 | HR 119 | Wt 137.0 lb

## 2019-09-09 DIAGNOSIS — Z3A28 28 weeks gestation of pregnancy: Secondary | ICD-10-CM

## 2019-09-09 DIAGNOSIS — Z131 Encounter for screening for diabetes mellitus: Secondary | ICD-10-CM

## 2019-09-09 DIAGNOSIS — O0990 Supervision of high risk pregnancy, unspecified, unspecified trimester: Secondary | ICD-10-CM

## 2019-09-09 DIAGNOSIS — O09893 Supervision of other high risk pregnancies, third trimester: Secondary | ICD-10-CM

## 2019-09-09 NOTE — Progress Notes (Signed)
ROB/ GTT No concerns Denies lof, no vb, Good FM

## 2019-09-09 NOTE — Progress Notes (Signed)
Routine Prenatal Care Visit  Subjective  Maureen Black is a 29 y.o. G1P0000 at [redacted]w[redacted]d being seen today for ongoing prenatal care.  She is currently monitored for the following issues for this high-risk pregnancy and has Supervision of high risk pregnancy, antepartum and Pregnancy conceived through in vitro fertilization on their problem list.  ----------------------------------------------------------------------------------- Patient reports no complaints.  Reports ongoing nausea treated and relieved by bonjesta. Contractions: Not present. Vag. Bleeding: None.  Movement: Present. Denies leaking of fluid.  ----------------------------------------------------------------------------------- The following portions of the patient's history were reviewed and updated as appropriate: allergies, current medications, past family history, past medical history, past social history, past surgical history and problem list. Problem list updated.   Objective  Blood pressure 120/70, pulse (!) 119, weight 137 lb (62.1 kg). Pregravid weight 130 lb (59 kg) Total Weight Gain 7 lb (3.175 kg) Urinalysis:      Fetal Status: Fetal Heart Rate (bpm): 140 Fundal Height: 29 cm Movement: Present     General:  Alert, oriented and cooperative. Patient is in no acute distress.  Skin: Skin is warm and dry. No rash noted.   Cardiovascular: Normal heart rate noted  Respiratory: Normal respiratory effort, no problems with respiration noted  Abdomen: Soft, gravid, appropriate for gestational age. Pain/Pressure: Absent     Pelvic:  Cervical exam deferred        Extremities: Normal range of motion.  Edema: None  Mental Status: Normal mood and affect. Normal behavior. Normal judgment and thought content.     Assessment   29 y.o. G1P0000 at [redacted]w[redacted]d by  12/01/2019, Date entered prior to episode creation presenting for routine prenatal visit  Plan   FIRST Problems (from 05/01/19 to present)    Problem Noted  Resolved   Supervision of high risk pregnancy, antepartum 05/01/2019 by Conard Novak, MD No   Overview Addendum 07/16/2019  8:52 PM by Vena Austria, MD    Clinic [facility] Prenatal Labs  Dating Embryo transfer Blood type: O+  Genetic Screen 1 Screen:    AFP:     Quad:     NIPS: Antibody: Negative  Anatomic [place] Normal Rubella: Immune Varicella: [ ]  needs  GTT Early:               Third trimester:  RPR: NR  Rhogam n/a HBsAg: neg  TDaP vaccine                       Flu Shot: HIV: negative  Baby Food                                GBS:   Contraception  Pap: NILM 1 year ago (05/2018)  CBB     CS/VBAC    Support Person 06/2018 - husband           Pregnancy conceived through in vitro fertilization 05/01/2019 by 05/03/2019, MD No   Overview Addendum 08/12/2019 10:20 AM by 08/14/2019, MD    [x]  ECHO at 22 weeks [  ] Growth Nadara Mustard third trimester [  ] IOL by [redacted] weeks          Gestational age appropriate obstetric precautions including but not limited to vaginal bleeding, contractions, leaking of fluid and fetal movement were reviewed in detail with the patient.    28 week labs today. Discussed nausea and weight gain.   Return in about  2 weeks (around 09/23/2019) for ROB in person.  Homero Fellers MD [facility], [facility] 09/09/2019, 10:41 AM

## 2019-09-09 NOTE — Patient Instructions (Signed)
Third Trimester of Pregnancy The third trimester is from week 28 through week 40 (months 7 through 9). The third trimester is a time when the unborn baby (fetus) is growing rapidly. At the end of the ninth month, the fetus is about 20 inches in length and weighs 6-10 pounds. Body changes during your third trimester Your body will continue to go through many changes during pregnancy. The changes vary from woman to woman. During the third trimester:  Your weight will continue to increase. You can expect to gain 25-35 pounds (11-16 kg) by the end of the pregnancy.  You may begin to get stretch marks on your hips, abdomen, and breasts.  You may urinate more often because the fetus is moving lower into your pelvis and pressing on your bladder.  You may develop or continue to have heartburn. This is caused by increased hormones that slow down muscles in the digestive tract.  You may develop or continue to have constipation because increased hormones slow digestion and cause the muscles that push waste through your intestines to relax.  You may develop hemorrhoids. These are swollen veins (varicose veins) in the rectum that can itch or be painful.  You may develop swollen, bulging veins (varicose veins) in your legs.  You may have increased body aches in the pelvis, back, or thighs. This is due to weight gain and increased hormones that are relaxing your joints.  You may have changes in your hair. These can include thickening of your hair, rapid growth, and changes in texture. Some women also have hair loss during or after pregnancy, or hair that feels dry or thin. Your hair will most likely return to normal after your baby is born.  Your breasts will continue to grow and they will continue to become tender. A yellow fluid (colostrum) may leak from your breasts. This is the first milk you are producing for your baby.  Your belly button may stick out.  You may notice more swelling in your hands,  face, or ankles.  You may have increased tingling or numbness in your hands, arms, and legs. The skin on your belly may also feel numb.  You may feel short of breath because of your expanding uterus.  You may have more problems sleeping. This can be caused by the size of your belly, increased need to urinate, and an increase in your body's metabolism.  You may notice the fetus "dropping," or moving lower in your abdomen (lightening).  You may have increased vaginal discharge.  You may notice your joints feel loose and you may have pain around your pelvic bone. What to expect at prenatal visits You will have prenatal exams every 2 weeks until week 36. Then you will have weekly prenatal exams. During a routine prenatal visit:  You will be weighed to make sure you and the baby are growing normally.  Your blood pressure will be taken.  Your abdomen will be measured to track your baby's growth.  The fetal heartbeat will be listened to.  Any test results from the previous visit will be discussed.  You may have a cervical check near your due date to see if your cervix has softened or thinned (effaced).  You will be tested for Group B streptococcus. This happens between 35 and 37 weeks. Your health care provider may ask you:  What your birth plan is.  How you are feeling.  If you are feeling the baby move.  If you have had any abnormal   symptoms, such as leaking fluid, bleeding, severe headaches, or abdominal cramping.  If you are using any tobacco products, including cigarettes, chewing tobacco, and electronic cigarettes.  If you have any questions. Other tests or screenings that may be performed during your third trimester include:  Blood tests that check for low iron levels (anemia).  Fetal testing to check the health, activity level, and growth of the fetus. Testing is done if you have certain medical conditions or if there are problems during the pregnancy.  Nonstress test  (NST). This test checks the health of your baby to make sure there are no signs of problems, such as the baby not getting enough oxygen. During this test, a belt is placed around your belly. The baby is made to move, and its heart rate is monitored during movement. What is false labor? False labor is a condition in which you feel small, irregular tightenings of the muscles in the womb (contractions) that usually go away with rest, changing position, or drinking water. These are called Braxton Hicks contractions. Contractions may last for hours, days, or even weeks before true labor sets in. If contractions come at regular intervals, become more frequent, increase in intensity, or become painful, you should see your health care provider. What are the signs of labor?  Abdominal cramps.  Regular contractions that start at 10 minutes apart and become stronger and more frequent with time.  Contractions that start on the top of the uterus and spread down to the lower abdomen and back.  Increased pelvic pressure and dull back pain.  A watery or bloody mucus discharge that comes from the vagina.  Leaking of amniotic fluid. This is also known as your "water breaking." It could be a slow trickle or a gush. Let your health care provider know if it has a color or strange odor. If you have any of these signs, call your health care provider right away, even if it is before your due date. Follow these instructions at home: Medicines  Follow your health care provider's instructions regarding medicine use. Specific medicines may be either safe or unsafe to take during pregnancy.  Take a prenatal vitamin that contains at least 600 micrograms (mcg) of folic acid.  If you develop constipation, try taking a stool softener if your health care provider approves. Eating and drinking   Eat a balanced diet that includes fresh fruits and vegetables, whole grains, good sources of protein such as meat, eggs, or tofu,  and low-fat dairy. Your health care provider will help you determine the amount of weight gain that is right for you.  Avoid raw meat and uncooked cheese. These carry germs that can cause birth defects in the baby.  If you have low calcium intake from food, talk to your health care provider about whether you should take a daily calcium supplement.  Eat four or five small meals rather than three large meals a day.  Limit foods that are high in fat and processed sugars, such as fried and sweet foods.  To prevent constipation: ? Drink enough fluid to keep your urine clear or pale yellow. ? Eat foods that are high in fiber, such as fresh fruits and vegetables, whole grains, and beans. Activity  Exercise only as directed by your health care provider. Most women can continue their usual exercise routine during pregnancy. Try to exercise for 30 minutes at least 5 days a week. Stop exercising if you experience uterine contractions.  Avoid heavy lifting.  Do   not exercise in extreme heat or humidity, or at high altitudes.  Wear low-heel, comfortable shoes.  Practice good posture.  You may continue to have sex unless your health care provider tells you otherwise. Relieving pain and discomfort  Take frequent breaks and rest with your legs elevated if you have leg cramps or low back pain.  Take warm sitz baths to soothe any pain or discomfort caused by hemorrhoids. Use hemorrhoid cream if your health care provider approves.  Wear a good support bra to prevent discomfort from breast tenderness.  If you develop varicose veins: ? Wear support pantyhose or compression stockings as told by your healthcare provider. ? Elevate your feet for 15 minutes, 3-4 times a day. Prenatal care  Write down your questions. Take them to your prenatal visits.  Keep all your prenatal visits as told by your health care provider. This is important. Safety  Wear your seat belt at all times when driving.  Make  a list of emergency phone numbers, including numbers for family, friends, the hospital, and police and fire departments. General instructions  Avoid cat litter boxes and soil used by cats. These carry germs that can cause birth defects in the baby. If you have a cat, ask someone to clean the litter box for you.  Do not travel far distances unless it is absolutely necessary and only with the approval of your health care provider.  Do not use hot tubs, steam rooms, or saunas.  Do not drink alcohol.  Do not use any products that contain nicotine or tobacco, such as cigarettes and e-cigarettes. If you need help quitting, ask your health care provider.  Do not use any medicinal herbs or unprescribed drugs. These chemicals affect the formation and growth of the baby.  Do not douche or use tampons or scented sanitary pads.  Do not cross your legs for long periods of time.  To prepare for the arrival of your baby: ? Take prenatal classes to understand, practice, and ask questions about labor and delivery. ? Make a trial run to the hospital. ? Visit the hospital and tour the maternity area. ? Arrange for maternity or paternity leave through employers. ? Arrange for family and friends to take care of pets while you are in the hospital. ? Purchase a rear-facing car seat and make sure you know how to install it in your car. ? Pack your hospital bag. ? Prepare the baby's nursery. Make sure to remove all pillows and stuffed animals from the baby's crib to prevent suffocation.  Visit your dentist if you have not gone during your pregnancy. Use a soft toothbrush to brush your teeth and be gentle when you floss. Contact a health care provider if:  You are unsure if you are in labor or if your water has broken.  You become dizzy.  You have mild pelvic cramps, pelvic pressure, or nagging pain in your abdominal area.  You have lower back pain.  You have persistent nausea, vomiting, or  diarrhea.  You have an unusual or bad smelling vaginal discharge.  You have pain when you urinate. Get help right away if:  Your water breaks before 37 weeks.  You have regular contractions less than 5 minutes apart before 37 weeks.  You have a fever.  You are leaking fluid from your vagina.  You have spotting or bleeding from your vagina.  You have severe abdominal pain or cramping.  You have rapid weight loss or weight gain.  You have   shortness of breath with chest pain.  You notice sudden or extreme swelling of your face, hands, ankles, feet, or legs.  Your baby makes fewer than 10 movements in 2 hours.  You have severe headaches that do not go away when you take medicine.  You have vision changes. Summary  The third trimester is from week 28 through week 40, months 7 through 9. The third trimester is a time when the unborn baby (fetus) is growing rapidly.  During the third trimester, your discomfort may increase as you and your baby continue to gain weight. You may have abdominal, leg, and back pain, sleeping problems, and an increased need to urinate.  During the third trimester your breasts will keep growing and they will continue to become tender. A yellow fluid (colostrum) may leak from your breasts. This is the first milk you are producing for your baby.  False labor is a condition in which you feel small, irregular tightenings of the muscles in the womb (contractions) that eventually go away. These are called Braxton Hicks contractions. Contractions may last for hours, days, or even weeks before true labor sets in.  Signs of labor can include: abdominal cramps; regular contractions that start at 10 minutes apart and become stronger and more frequent with time; watery or bloody mucus discharge that comes from the vagina; increased pelvic pressure and dull back pain; and leaking of amniotic fluid. This information is not intended to replace advice given to you by your  health care provider. Make sure you discuss any questions you have with your health care provider. Document Revised: 10/24/2018 Document Reviewed: 08/08/2016 Elsevier Patient Education  2020 [employer]  

## 2019-09-10 LAB — 28 WEEK RH+PANEL
Basophils Absolute: 0.1 10*3/uL (ref 0.0–0.2)
Basos: 1 %
EOS (ABSOLUTE): 0.1 10*3/uL (ref 0.0–0.4)
Eos: 1 %
Gestational Diabetes Screen: 50 mg/dL — ABNORMAL LOW (ref 65–139)
HIV Screen 4th Generation wRfx: NONREACTIVE
Hematocrit: 35.6 % (ref 34.0–46.6)
Hemoglobin: 11.7 g/dL (ref 11.1–15.9)
Immature Grans (Abs): 0.7 10*3/uL — ABNORMAL HIGH (ref 0.0–0.1)
Immature Granulocytes: 5 %
Lymphocytes Absolute: 2.2 10*3/uL (ref 0.7–3.1)
Lymphs: 16 %
MCH: 28.7 pg (ref 26.6–33.0)
MCHC: 32.9 g/dL (ref 31.5–35.7)
MCV: 87 fL (ref 79–97)
Monocytes Absolute: 1.2 10*3/uL — ABNORMAL HIGH (ref 0.1–0.9)
Monocytes: 9 %
Neutrophils Absolute: 9.5 10*3/uL — ABNORMAL HIGH (ref 1.4–7.0)
Neutrophils: 68 %
Platelets: 308 10*3/uL (ref 150–450)
RBC: 4.08 x10E6/uL (ref 3.77–5.28)
RDW: 12.6 % (ref 11.7–15.4)
RPR Ser Ql: NONREACTIVE
WBC: 13.9 10*3/uL — ABNORMAL HIGH (ref 3.4–10.8)

## 2019-09-23 ENCOUNTER — Ambulatory Visit (INDEPENDENT_AMBULATORY_CARE_PROVIDER_SITE_OTHER): Payer: Managed Care, Other (non HMO) | Admitting: Obstetrics and Gynecology

## 2019-09-23 ENCOUNTER — Other Ambulatory Visit: Payer: Self-pay

## 2019-09-23 VITALS — BP 112/58 | Wt 139.0 lb

## 2019-09-23 DIAGNOSIS — O09812 Supervision of pregnancy resulting from assisted reproductive technology, second trimester: Secondary | ICD-10-CM

## 2019-09-23 DIAGNOSIS — O09813 Supervision of pregnancy resulting from assisted reproductive technology, third trimester: Secondary | ICD-10-CM

## 2019-09-23 DIAGNOSIS — Z3A30 30 weeks gestation of pregnancy: Secondary | ICD-10-CM

## 2019-09-23 DIAGNOSIS — Z23 Encounter for immunization: Secondary | ICD-10-CM

## 2019-09-23 DIAGNOSIS — O09893 Supervision of other high risk pregnancies, third trimester: Secondary | ICD-10-CM

## 2019-09-23 DIAGNOSIS — O0990 Supervision of high risk pregnancy, unspecified, unspecified trimester: Secondary | ICD-10-CM

## 2019-09-23 LAB — POCT URINALYSIS DIPSTICK OB: POC,PROTEIN,UA: NEGATIVE

## 2019-09-23 NOTE — Progress Notes (Signed)
    Routine Prenatal Care Visit  Subjective  Maureen Black is a 29 y.o. G1P0000 at [redacted]w[redacted]d being seen today for ongoing prenatal care.  She is currently monitored for the following issues for this low-risk pregnancy and has Supervision of high risk pregnancy, antepartum and Pregnancy conceived through in vitro fertilization on their problem list.  ----------------------------------------------------------------------------------- Patient reports no complaints.   Contractions: Not present. Vag. Bleeding: None.  Movement: Present. Denies leaking of fluid.  ----------------------------------------------------------------------------------- The following portions of the patient's history were reviewed and updated as appropriate: allergies, current medications, past family history, past medical history, past social history, past surgical history and problem list. Problem list updated.   Objective  Blood pressure (!) 112/58, weight 139 lb (63 kg). Pregravid weight 130 lb (59 kg) Total Weight Gain 9 lb (4.082 kg) Urinalysis:      Fetal Status: Fetal Heart Rate (bpm): 135 Fundal Height: 29 cm Movement: Present  Presentation: Vertex  General:  Alert, oriented and cooperative. Patient is in no acute distress.  Skin: Skin is warm and dry. No rash noted.   Cardiovascular: Normal heart rate noted  Respiratory: Normal respiratory effort, no problems with respiration noted  Abdomen: Soft, gravid, appropriate for gestational age. Pain/Pressure: Absent     Pelvic:  Cervical exam deferred        Extremities: Normal range of motion.     ental Status: Normal mood and affect. Normal behavior. Normal judgment and thought content.     Assessment   29 y.o. G1P0000 at [redacted]w[redacted]d by  12/01/2019, Date entered prior to episode creation presenting for routine prenatal visit  Plan   FIRST Problems (from 05/01/19 to present)    Problem Noted Resolved   Supervision of high risk pregnancy, antepartum  05/01/2019 by Conard Novak, MD No   Overview Addendum 07/16/2019  8:52 PM by Vena Austria, MD    [facility]inic [facility] Prenatal Labs  Dating Embryo transfer Blood type: O+  Genetic Screen NIPS: normal female Antibody: Negative  Anatomic [place] Normal Rubella: Immune Varicella: [ ]  needs  GTT 50 RPR: NR  Rhogam n/a HBsAg: neg  TDaP vaccine 09/23/2019 Flu Shot: HIV: negative  Baby Food                                GBS:   Contraception  Pap: NILM 1 year ago (05/2018)  CBB     CS/VBAC N/A   Support Person 06/2018 - husband           Pregnancy conceived through in vitro fertilization 05/01/2019 by 05/03/2019, MD No   Overview Addendum 08/12/2019 10:20 AM by 08/14/2019, MD    [x]  ECHO at 22 weeks [  ] Growth Nadara Mustard third trimester [  ] IOL by [redacted] weeks          Gestational age appropriate obstetric precautions including but not limited to vaginal bleeding, contractions, leaking of fluid and fetal movement were reviewed in detail with the patient.    Return in about 2 weeks (around 10/07/2019) for ROB.  39, MD, 10/09/2019 [facility], [facility] 09/23/2019, 9:36 AM

## 2019-09-23 NOTE — Lactation Note (Signed)
Lactation Consultation Note  Patient Name: Shiri Hodapp PJKDT'O Date: 09/23/2019     Maternal Data    Feeding    LATCH Score                   Interventions    Lactation Tools Discussed/Used     Consult Status   Lactation student discussed benefits of breastfeeding per the Ready, Set, Baby curriculum. Gaytha Kingsberry [place] encouraged to review breastfeeding information on Ready, set, [employer] site and given information for virtual breastfeeding classes.     Arlyss Gandy 09/23/2019, 2:55 PM

## 2019-09-23 NOTE — Progress Notes (Signed)
ROB Concerned about not gaining weight  TDAP today

## 2019-10-07 ENCOUNTER — Other Ambulatory Visit: Payer: Self-pay

## 2019-10-07 ENCOUNTER — Ambulatory Visit (INDEPENDENT_AMBULATORY_CARE_PROVIDER_SITE_OTHER): Payer: Managed Care, Other (non HMO) | Admitting: Certified Nurse Midwife

## 2019-10-07 VITALS — BP 100/60 | Temp 97.4°F | Wt 140.0 lb

## 2019-10-07 DIAGNOSIS — O09893 Supervision of other high risk pregnancies, third trimester: Secondary | ICD-10-CM

## 2019-10-07 DIAGNOSIS — O0990 Supervision of high risk pregnancy, unspecified, unspecified trimester: Secondary | ICD-10-CM

## 2019-10-07 DIAGNOSIS — O09813 Supervision of pregnancy resulting from assisted reproductive technology, third trimester: Secondary | ICD-10-CM

## 2019-10-07 DIAGNOSIS — O09812 Supervision of pregnancy resulting from assisted reproductive technology, second trimester: Secondary | ICD-10-CM

## 2019-10-07 DIAGNOSIS — Z3A32 32 weeks gestation of pregnancy: Secondary | ICD-10-CM

## 2019-10-07 LAB — POCT URINALYSIS DIPSTICK OB: Glucose, UA: NEGATIVE

## 2019-10-07 NOTE — Progress Notes (Signed)
C/o pt got very hot and felt faint while being worked up. Cold paper towels applied to back of neck then over mouth and fanned pt til she felt better.  Ate fruit before coming.rj

## 2019-10-08 NOTE — Progress Notes (Signed)
ROB at 32wk1d: Had a ?vasovagal reaction while being checked in. Got hot and felt faint. OK now. Baby active.Net weight gain of 10#, but also gained back the 4# she lost initiailly. Eating healthy. 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

## 2019-10-21 ENCOUNTER — Encounter: Payer: Managed Care, Other (non HMO) | Admitting: Advanced Practice Midwife

## 2019-10-23 ENCOUNTER — Ambulatory Visit (INDEPENDENT_AMBULATORY_CARE_PROVIDER_SITE_OTHER): Payer: Managed Care, Other (non HMO)

## 2019-10-23 ENCOUNTER — Ambulatory Visit (INDEPENDENT_AMBULATORY_CARE_PROVIDER_SITE_OTHER): Payer: Managed Care, Other (non HMO) | Admitting: Advanced Practice Midwife

## 2019-10-23 ENCOUNTER — Encounter: Payer: Self-pay | Admitting: Advanced Practice Midwife

## 2019-10-23 ENCOUNTER — Other Ambulatory Visit: Payer: Self-pay

## 2019-10-23 VITALS — BP 122/70 | Wt 145.0 lb

## 2019-10-23 DIAGNOSIS — O09893 Supervision of other high risk pregnancies, third trimester: Secondary | ICD-10-CM

## 2019-10-23 DIAGNOSIS — Z3A34 34 weeks gestation of pregnancy: Secondary | ICD-10-CM | POA: Diagnosis not present

## 2019-10-23 DIAGNOSIS — O365930 Maternal care for other known or suspected poor fetal growth, third trimester, not applicable or unspecified: Secondary | ICD-10-CM

## 2019-10-23 DIAGNOSIS — O09812 Supervision of pregnancy resulting from assisted reproductive technology, second trimester: Secondary | ICD-10-CM

## 2019-10-23 DIAGNOSIS — O09813 Supervision of pregnancy resulting from assisted reproductive technology, third trimester: Secondary | ICD-10-CM

## 2019-10-23 DIAGNOSIS — Z362 Encounter for other antenatal screening follow-up: Secondary | ICD-10-CM

## 2019-10-23 DIAGNOSIS — O0990 Supervision of high risk pregnancy, unspecified, unspecified trimester: Secondary | ICD-10-CM

## 2019-10-23 DIAGNOSIS — M545 Low back pain: Secondary | ICD-10-CM

## 2019-10-23 NOTE — Progress Notes (Signed)
U/s today. No vb. No lof.  

## 2019-10-23 NOTE — Progress Notes (Signed)
  Routine Prenatal Care Visit  Subjective  Maureen Black is a 29 y.o. G1P0000 at [redacted]w[redacted]d being seen today for ongoing prenatal care.  She is currently monitored for the following issues for this high-risk pregnancy and has Supervision of high risk pregnancy, antepartum and Pregnancy conceived through in vitro fertilization on their problem list.  ----------------------------------------------------------------------------------- Patient reports low back pain and sharp pain at times around navel when baby moves.  We discussed results of growth/afi and follow up recommendations.  Contractions: Not present. Vag. Bleeding: None.  Movement: Present. Leaking Fluid denies.  ----------------------------------------------------------------------------------- The following portions of the patient's history were reviewed and updated as appropriate: allergies, current medications, past family history, past medical history, past social history, past surgical history and problem list. Problem list updated.  Objective  Blood pressure 122/70, weight 145 lb (65.8 kg). Pregravid weight 130 lb (59 kg) Total Weight Gain 15 lb (6.804 kg) Urinalysis: Urine Protein    Urine Glucose    Fetal Status: Fetal Heart Rate (bpm): 138 Fundal Height: 35 cm Movement: Present  Presentation: Vertex   Growth/AFI: 28%, AC 47.3%, 4 pounds 13 ounces, AFI: 6.6 cm, cephalic, anterior placenta  General:  Alert, oriented and cooperative. Patient is in no acute distress.  Skin: Skin is warm and dry. No rash noted.   Cardiovascular: Normal heart rate noted  Respiratory: Normal respiratory effort, no problems with respiration noted  Abdomen: Soft, gravid, appropriate for gestational age. Pain/Pressure: Absent     Pelvic:  Cervical exam deferred        Extremities: Normal range of motion.  Edema: None  Mental Status: Normal mood and affect. Normal behavior. Normal judgment and thought content.   Assessment   29 y.o.  G1P0000 at [redacted]w[redacted]d by  12/01/2019, Date entered prior to episode creation presenting for routine prenatal visit  Plan   FIRST Problems (from 05/01/19 to present)    Problem Noted Resolved   Supervision of high risk pregnancy, antepartum 05/01/2019 by Conard Novak, MD No   Overview Addendum 09/23/2019  9:38 AM by Vena Austria, MD    [facility] Prenatal Labs  Dating Embryo transfer Blood type: O+  Genetic Screen NIPS: normal XY Antibody: Negative  Anatomic [place] Normal Rubella: Immune Varicella: [ ]  needs  GTT 50 RPR: NR  Rhogam n/a HBsAg: neg  TDaP vaccine 09/23/2019  Flu Shot: HIV: negative  Baby Food                                GBS:   Contraception  Pap: NILM 1 year ago (05/2018)  CBB     CS/VBAC N/A   Support Person 06/2018 - husband           Pregnancy conceived through in vitro fertilization 05/01/2019 by 05/03/2019, MD No   Overview Addendum 08/12/2019 10:20 AM by 08/14/2019, MD    [x]  ECHO at 22 weeks [x]  Growth Nadara Mustard third trimester [  ] IOL by 40 weeks          Preterm labor symptoms and general obstetric precautions including but not limited to vaginal bleeding, contractions, leaking of fluid and fetal movement were reviewed in detail with the patient.   Return in about 2 weeks (around 11/06/2019) for afi/rob.  Consider growth at 38 weeks IOL by 40 weeks per delivery recommendations (SDJ/RPH)  , CNM 10/23/2019 10:23 AM

## 2019-10-27 ENCOUNTER — Telehealth: Payer: Self-pay | Admitting: Obstetrics & Gynecology

## 2019-10-27 ENCOUNTER — Other Ambulatory Visit: Payer: Self-pay | Admitting: Obstetrics & Gynecology

## 2019-10-27 NOTE — Telephone Encounter (Signed)
-----   Message from Nadara Mustard, MD sent at 10/27/2019 12:44 PM EDT ----- Regarding: Sch appt w [place] for AFI Friday For f/u, instead of waiting two weeks

## 2019-10-27 NOTE — Telephone Encounter (Signed)
Patient is reschedule to 10/31/19 at 8 am and 9 am for ROB. Patient has questions to why she need to be seen this week instead of on 11/07/19. Please advise

## 2019-10-31 ENCOUNTER — Ambulatory Visit (INDEPENDENT_AMBULATORY_CARE_PROVIDER_SITE_OTHER): Payer: Managed Care, Other (non HMO)

## 2019-10-31 ENCOUNTER — Other Ambulatory Visit: Payer: Self-pay

## 2019-10-31 ENCOUNTER — Encounter: Payer: Self-pay | Admitting: Advanced Practice Midwife

## 2019-10-31 ENCOUNTER — Ambulatory Visit (INDEPENDENT_AMBULATORY_CARE_PROVIDER_SITE_OTHER): Payer: Managed Care, Other (non HMO) | Admitting: Advanced Practice Midwife

## 2019-10-31 VITALS — BP 100/60 | Wt 146.0 lb

## 2019-10-31 DIAGNOSIS — Z3A35 35 weeks gestation of pregnancy: Secondary | ICD-10-CM

## 2019-10-31 DIAGNOSIS — O0993 Supervision of high risk pregnancy, unspecified, third trimester: Secondary | ICD-10-CM

## 2019-10-31 LAB — POCT URINALYSIS DIPSTICK OB
Glucose, UA: NEGATIVE
POC,PROTEIN,UA: NEGATIVE

## 2019-10-31 NOTE — Progress Notes (Signed)
  Routine Prenatal Care Visit  Subjective  Maureen Black is a 29 y.o. G1P0000 at [redacted]w[redacted]d being seen today for ongoing prenatal care.  She is currently monitored for the following issues for this high-risk pregnancy and has Supervision of high risk pregnancy, antepartum and Pregnancy conceived through in vitro fertilization on their problem list.  ----------------------------------------------------------------------------------- Patient reports no complaints.   Contractions: Not present. Vag. Bleeding: None.  Movement: Present. Leaking Fluid denies.  ----------------------------------------------------------------------------------- The following portions of the patient's history were reviewed and updated as appropriate: allergies, current medications, past family history, past medical history, past social history, past surgical history and problem list. Problem list updated.  Objective  Blood pressure 100/60, weight 146 lb (66.2 kg). Pregravid weight 130 lb (59 kg) Total Weight Gain 16 lb (7.258 kg) Urinalysis: Urine Protein Negative  Urine Glucose Negative  Fetal Status: Fetal Heart Rate (bpm): 130 Fundal Height: 36 cm Movement: Present  Presentation: Vertex   AFI: 6.7  General:  Alert, oriented and cooperative. Patient is in no acute distress.  Skin: Skin is warm and dry. No rash noted.   Cardiovascular: Normal heart rate noted  Respiratory: Normal respiratory effort, no problems with respiration noted  Abdomen: Soft, gravid, appropriate for gestational age. Pain/Pressure: Present     Pelvic:  Cervical exam deferred        Extremities: Normal range of motion.     Mental Status: Normal mood and affect. Normal behavior. Normal judgment and thought content.   Assessment   29 y.o. G1P0000 at [redacted]w[redacted]d by  12/01/2019, Date entered prior to episode creation presenting for routine prenatal visit  Plan   FIRST Problems (from 05/01/19 to present)    Problem Noted Resolved   Supervision of high risk pregnancy, antepartum 05/01/2019 by Conard Novak, MD No   Overview Addendum 09/23/2019  9:38 AM by Vena Austria, MD    [facility] Prenatal Labs  Dating Embryo transfer Blood type: O+  Genetic Screen NIPS: normal XY Antibody: Negative  Anatomic [place] Normal Rubella: Immune Varicella: [ ]  needs  GTT 50 RPR: NR  Rhogam n/a HBsAg: neg  TDaP vaccine 09/23/2019  Flu Shot: HIV: negative  Baby Food                                GBS:   Contraception  Pap: NILM 1 year ago (05/2018)  CBB     CS/VBAC N/A   Support Person 06/2018 - husband           Pregnancy conceived through in vitro fertilization 05/01/2019 by 05/03/2019, MD No   Overview Addendum 08/12/2019 10:20 AM by 08/14/2019, MD    [x]  ECHO at 22 weeks [  ] Growth Nadara Mustard third trimester [  ] IOL by 40 weeks          Preterm labor symptoms and general obstetric precautions including but not limited to vaginal bleeding, contractions, leaking of fluid and fetal movement were reviewed in detail with the patient.    Return in about 1 week (around 11/07/2019) for AFI/NST/ROB.  [place], CNM 10/31/2019 8:56 AM

## 2019-10-31 NOTE — Progress Notes (Signed)
ROB/AFI- no concerns

## 2019-11-07 ENCOUNTER — Ambulatory Visit (INDEPENDENT_AMBULATORY_CARE_PROVIDER_SITE_OTHER): Payer: Managed Care, Other (non HMO) | Admitting: Obstetrics & Gynecology

## 2019-11-07 ENCOUNTER — Other Ambulatory Visit: Payer: Self-pay

## 2019-11-07 ENCOUNTER — Encounter: Payer: Managed Care, Other (non HMO) | Admitting: Obstetrics and Gynecology

## 2019-11-07 ENCOUNTER — Ambulatory Visit: Payer: Managed Care, Other (non HMO)

## 2019-11-07 ENCOUNTER — Ambulatory Visit (INDEPENDENT_AMBULATORY_CARE_PROVIDER_SITE_OTHER): Payer: Managed Care, Other (non HMO)

## 2019-11-07 ENCOUNTER — Encounter: Payer: Self-pay | Admitting: Obstetrics & Gynecology

## 2019-11-07 VITALS — BP 120/80 | Wt 147.0 lb

## 2019-11-07 DIAGNOSIS — Z3685 Encounter for antenatal screening for Streptococcus B: Secondary | ICD-10-CM

## 2019-11-07 DIAGNOSIS — O0993 Supervision of high risk pregnancy, unspecified, third trimester: Secondary | ICD-10-CM

## 2019-11-07 DIAGNOSIS — Z3A36 36 weeks gestation of pregnancy: Secondary | ICD-10-CM | POA: Diagnosis not present

## 2019-11-07 DIAGNOSIS — O09813 Supervision of pregnancy resulting from assisted reproductive technology, third trimester: Secondary | ICD-10-CM | POA: Diagnosis not present

## 2019-11-07 DIAGNOSIS — O09812 Supervision of pregnancy resulting from assisted reproductive technology, second trimester: Secondary | ICD-10-CM

## 2019-11-07 NOTE — Progress Notes (Signed)
  Subjective  Fetal Movement? yes Contractions? no Leaking Fluid? no Vaginal Bleeding? no  Objective  BP 120/80   Wt 147 lb (66.7 kg)   BMI 25.23 kg/m  General: NAD Pumonary: no increased work of breathing Abdomen: gravid, non-tender Extremities: no edema Psychiatric: mood appropriate, affect full SVE 0/30/-3, tender exam  A NST procedure was performed with FHR monitoring and a normal baseline established, appropriate time of 20-40 minutes of evaluation, and accels >2 seen w 15x15 characteristics.  Results show a REACTIVE NST.   Review of ULTRASOUND.    I have personally reviewed images and report of recent ultrasound done at [facility].    Plan of management to be discussed with patient.    AFI steady at 6.6.  Vtx.  Assessment  29 y.o. G1P0000 at [redacted]w[redacted]d by  12/01/2019, Date entered prior to episode creation presenting for routine prenatal visit  Plan   Problem List Items Addressed This Visit      Other   Pregnancy conceived through in vitro fertilization   Relevant Orders   US OB Limited    Other Visit Diagnoses    [redacted] weeks gestation of pregnancy    -  Primary   Encounter for antenatal screening for Streptococcus B       Relevant Orders   Culture, beta strep (group b only)   High-risk pregnancy supervision, third trimester       Relevant Orders   US OB Limited      FIRST Problems (from 05/01/19 to present)    Problem Noted Resolved   Supervision of high risk pregnancy, antepartum 05/01/2019 by Conard Novak, MD No   Overview Addendum 09/23/2019  9:38 AM by Vena Austria, MD    [facility] Prenatal Labs  Dating Embryo transfer Blood type: O+  Genetic Screen NIPS: normal XY Antibody: Negative  Anatomic [place] Normal Rubella: Immune Varicella: [ ]  needs  GTT 50 RPR: NR  Rhogam n/a HBsAg: neg  TDaP vaccine 09/23/2019  Flu Shot: HIV: negative  Baby Food                                GBS:   Contraception  Pap: NILM 1 year ago (05/2018)  CBB     CS/VBAC N/A    Support Person 06/2018 - husband           Pregnancy conceived through in vitro fertilization 05/01/2019 by 05/03/2019, MD No   Overview Addendum 08/12/2019 10:20 AM by 08/14/2019, MD    [x]  ECHO at 22 weeks [x  ] Growth Nadara Mustard third trimester [x  ] IOL by 40 weeks        IOL discussed as option at 40 weeks, based on risk factor of ART pregnancy  Cont weekly NST and AFI based on borderline oligihydramnios  PNV, [facility]MC  GBS today; discussed  , MD, [facility], [facility] 11/07/2019  1:46 PM

## 2019-11-07 NOTE — Patient Instructions (Signed)
Group B Streptococcus Infection During Pregnancy °Group B Streptococcus (GBS) is a type of bacteria that is often found in healthy people. It is commonly found in the rectum, vagina, and intestines. In people who are healthy and not pregnant, the bacteria rarely cause serious illness or complications. However, women who test positive for GBS during pregnancy can pass the bacteria to the baby during childbirth. This can cause serious infection in the baby after birth. °Women with GBS may also have infections during their pregnancy or soon after childbirth. The infections include urinary tract infections (UTIs) or infections of the uterus. GBS also increases a woman's risk of complications during pregnancy, such as early labor or delivery, miscarriage, or stillbirth. Routine testing for GBS is recommended for all pregnant women. °What are the causes? °This condition is caused by bacteria called Streptococcus agalactiae. °What increases the risk? °You may have a higher risk for GBS infection during pregnancy if you had one during a past pregnancy. °What are the signs or symptoms? °In most cases, GBS infection does not cause symptoms in pregnant women. If symptoms exist, they may include: °· Labor that starts before the 37th week of pregnancy. °· A UTI or bladder infection. This may cause a fever, frequent urination, or pain and burning during urination. °· Fever during labor. There can also be a rapid heartbeat in the mother or baby. °Rare but serious symptoms of a GBS infection in women include: °· Blood infection (septicemia). This may cause fever, chills, or confusion. °· Lung infection (pneumonia). This may cause fever, chills, cough, rapid breathing, chest pain, or difficulty breathing. °· Bone, joint, skin, or soft tissue infection. °How is this diagnosed? °You may be screened for GBS between week 35 and week 37 of pregnancy. If you have symptoms of preterm labor, you may be screened earlier. This condition is  diagnosed based on lab test results from: °· A swab of fluid from the vagina and rectum. °· A urine sample. °How is this treated? °This condition is treated with antibiotic medicine. Antibiotic medicine may be given: °· To you when you go into labor, or as soon as your water breaks. The medicines will continue until after you give birth. If you are having a cesarean delivery, you do not need antibiotics unless your water has broken. °· To your baby, if he or she requires treatment. Your health care provider will check your baby to decide if he or she needs antibiotics to prevent a serious infection. °Follow these instructions at home: °· Take over-the-counter and prescription medicines only as told by your health care provider. °· Take your antibiotic medicine as told by your health care provider. Do not stop taking the antibiotic even if you start to feel better. °· Keep all pre-birth (prenatal) visits and follow-up visits as told by your health care provider. This is important. °Contact a health care provider if: °· You have pain or burning when you urinate. °· You have to urinate more often than usual. °· You have a fever or chills. °· You develop a bad-smelling vaginal discharge. °Get help right away if: °· Your water breaks. °· You go into labor. °· You have severe pain in your abdomen. °· You have difficulty breathing. °· You have chest pain. °These symptoms may represent a serious problem that is an emergency. Do not wait to see if the symptoms will go away. Get medical help right away. Call your local emergency services (911 in the [place] Do not drive yourself to   the hospital. °Summary °· GBS is a type of bacteria that is common in healthy people. °· During pregnancy, colonization with GBS can cause serious complications for you or your baby. °· Your health care provider will screen you between 35 and 37 weeks of pregnancy to determine if you are colonized with GBS. °· If you are colonized with GBS during  pregnancy, your health care provider will recommend antibiotics through an IV during labor. °· After delivery, your baby will be evaluated for complications related to potential GBS infection and may require antibiotics to prevent a serious infection. °This information is not intended to replace advice given to you by your health care provider. Make sure you discuss any questions you have with your health care provider. °Document Revised: 01/27/2019 Document Reviewed: 01/27/2019 °Elsevier Patient Education © 2020 [employer] ° °

## 2019-11-09 ENCOUNTER — Encounter: Payer: Self-pay | Admitting: Obstetrics & Gynecology

## 2019-11-09 ENCOUNTER — Observation Stay
Admission: EM | Admit: 2019-11-09 | Discharge: 2019-11-09 | Disposition: A | Discharge status: Home / Self Care | Payer: Managed Care, Other (non HMO) | Attending: Obstetrics & Gynecology | Admitting: Obstetrics & Gynecology

## 2019-11-09 DIAGNOSIS — R1010 Upper abdominal pain, unspecified: Secondary | ICD-10-CM | POA: Diagnosis not present

## 2019-11-09 DIAGNOSIS — O0990 Supervision of high risk pregnancy, unspecified, unspecified trimester: Secondary | ICD-10-CM | POA: Diagnosis not present

## 2019-11-09 DIAGNOSIS — O26893 Other specified pregnancy related conditions, third trimester: Secondary | ICD-10-CM | POA: Diagnosis not present

## 2019-11-09 DIAGNOSIS — M545 Low back pain: Secondary | ICD-10-CM | POA: Diagnosis not present

## 2019-11-09 DIAGNOSIS — O26899 Other specified pregnancy related conditions, unspecified trimester: Secondary | ICD-10-CM

## 2019-11-09 DIAGNOSIS — D563 Thalassemia minor: Secondary | ICD-10-CM | POA: Insufficient documentation

## 2019-11-09 DIAGNOSIS — R109 Unspecified abdominal pain: Secondary | ICD-10-CM

## 2019-11-09 DIAGNOSIS — R1013 Epigastric pain: Secondary | ICD-10-CM | POA: Diagnosis not present

## 2019-11-09 DIAGNOSIS — O99613 Diseases of the digestive system complicating pregnancy, third trimester: Secondary | ICD-10-CM | POA: Diagnosis not present

## 2019-11-09 DIAGNOSIS — Z3A36 36 weeks gestation of pregnancy: Secondary | ICD-10-CM | POA: Diagnosis not present

## 2019-11-09 DIAGNOSIS — O09813 Supervision of pregnancy resulting from assisted reproductive technology, third trimester: Secondary | ICD-10-CM

## 2019-11-09 DIAGNOSIS — K219 Gastro-esophageal reflux disease without esophagitis: Secondary | ICD-10-CM | POA: Insufficient documentation

## 2019-11-09 HISTORY — DX: Other specified pregnancy related conditions, unspecified trimester: O26.899

## 2019-11-09 HISTORY — DX: Unspecified abdominal pain: R10.9

## 2019-11-09 LAB — URINALYSIS, COMPLETE (UACMP) WITH MICROSCOPIC
Bilirubin Urine: NEGATIVE
Glucose, UA: NEGATIVE mg/dL
Ketones, ur: NEGATIVE mg/dL
Leukocytes,Ua: NEGATIVE
Nitrite: NEGATIVE
Specific Gravity, Urine: 1.02 (ref 1.005–1.030)
pH: 8 (ref 5.0–8.0)

## 2019-11-09 MED ORDER — LIDOCAINE HCL (PF) 1 % IJ SOLN: 30.0000 mL | INTRAMUSCULAR | Status: DC | PRN

## 2019-11-09 MED ORDER — ACETAMINOPHEN 325 MG PO TABS: 650.0000 mg | ORAL_TABLET | ORAL | Status: DC | PRN

## 2019-11-09 MED ORDER — ONDANSETRON HCL 4 MG/2ML IJ SOLN: 4.0000 mg | Freq: Four times a day (QID) | INTRAMUSCULAR | Status: DC | PRN

## 2019-11-09 NOTE — H&P (Signed)
Obstetrics Admission History & Physical   CC: Epigastric pain  HPI:  29 y.o. G1P0000 @ [redacted]w[redacted]d (12/01/2019, Date entered prior to episode creation). Admitted on 11/09/2019:   Patient Active Problem List   Diagnosis Date Noted  . Abdominal pain affecting pregnancy 11/09/2019  . Supervision of high risk pregnancy, antepartum 05/01/2019  . Pregnancy conceived through in vitro fertilization 05/01/2019    Presents for upper epigastric pain today, alao some low back pain.  No radiation of pain, no associated sx's, no modifiers that she has tried. Denies dysuria, fever, VB, ROM.    Prenatal care at: at [facility]. Pregnancy complicated by ART pregnancy.  ROS: A review of systems was performed and negative, except as stated in the above HPI. PMHx:  Past Medical History:  Diagnosis Date  . Abnormal Pap smear of cervix 2012  . Alpha thalassemia silent carrier   . Constipation    PSHx:  Past Surgical History:  Procedure Laterality Date  . DG SELECTED HSG [facility] ONLY    . HYSTEROSALPINGOGRAM     Medications:  Medications Prior to Admission  Medication Sig Dispense Refill Last Dose  . BONJESTA 20-20 MG TBCR Take 1 tablet by mouth 2 (two) times daily. 60 tablet 6 11/08/2019 at Unknown time  . Prenatal Vit-Fe Fumarate-FA (PRENATAL VITAMINS) 28-0.8 MG TABS Take 1 tablet by mouth daily.   11/08/2019 at Unknown time   Allergies: has No Known Allergies. OBHx:  OB History  Gravida Para Term Preterm AB Living  1 0 0 0 0 0  SAB TAB Ectopic Multiple Live Births  0 0 0 0 0    # Outcome Date GA Lbr Len/2nd Weight Sex Delivery Anes PTL Lv  1 Current            LPF:XTKWIOXB/[id number] except as detailed in HPI.Marland Kitchen  No family history of birth defects. Soc Hx: Alcohol: none, Recreational drug use: none and Pregnancy welcomed  Objective:   Vitals:   11/09/19 1713  BP: 126/76  Pulse: (!) 110  Temp: 97.9 F (36.6 C)   Constitutional: Well nourished, well developed female in no acute distress.   HEENT: normal Skin: Warm and dry.  Cardiovascular:Regular rate and rhythm.   Extremity: trace to 1+ bilateral pedal edema Respiratory: Clear to auscultation bilateral. Normal respiratory effort Abdomen: gravid, ND, FHT present, mild epigastric tenderness on exam, no suprapubic pain Back: no CVAT, n flank T Neuro: DTRs 2+, Cranial nerves grossly intact Psych: Alert and Oriented x3. No memory deficits. Normal mood and affect.  MS: normal gait, normal bilateral lower extremity ROM/strength/stability.  Pelvic exam: is not limited by body habitus EGBUS: within normal limits Vagina: within normal limits and with normal mucosa Cervix: closed, 80% Uterus: No contractions observed for 60 minutes.  Adnexa: not evaluated  EFM:FHR: 130 bpm, variability: moderate,  accelerations:  Present,  decelerations:  Absent Toco: None  Results for orders placed or performed during the hospital encounter of 11/09/19  Urinalysis, Complete w Microscopic  Result Value Ref Range   Color, Urine YELLOW YELLOW   APPearance HAZY (A) CLEAR   Specific Gravity, Urine 1.020 1.005 - 1.030   pH 8.0 5.0 - 8.0   Glucose, UA NEGATIVE NEGATIVE mg/dL   Hgb urine dipstick TRACE (A) NEGATIVE   Bilirubin Urine NEGATIVE NEGATIVE   Ketones, ur NEGATIVE NEGATIVE mg/dL   Protein, ur TRACE (A) NEGATIVE mg/dL   Nitrite NEGATIVE NEGATIVE   Leukocytes,Ua NEGATIVE NEGATIVE   Squamous Epithelial / LPF 0-5 0 - 5  WBC, UA 0-5 0 - 5 WBC/hpf   RBC / HPF 0-5 0 - 5 RBC/hpf   Bacteria, UA MANY (A) NONE SEEN   Mucus PRESENT     Assessment & Plan:   29 y.o. G1P0000 @ [redacted]w[redacted]d, Admitted on 11/09/2019: GERD, Abdominal Pain of Pregnancy No S/Sx PTL No S/Sx UTI or pyelonephritis  This is a new problem for this patient this pregnancy and in general, work up started today.    EFM. UA.  Preterm labor precautions d/w pt.  Annamarie Major, MD, [facility], [facility] 11/09/2019  6:26 PM

## 2019-11-09 NOTE — OB Triage Note (Signed)
Patient here for contractions that she has had off an on all day today. She rates these contractions an 8 out of 10. Denies LOF and bleeding.

## 2019-11-09 NOTE — Discharge Summary (Signed)
Physician Discharge Summary  Patient ID: Maureen Black MRN: [id number] DOB/AGE: [birth year] 29 y.o.  Admit date: 11/09/2019 Discharge date: 11/09/2019  Admission Diagnoses:Upper abdominal pain in pregnancy, 36 weeks pregnancy.  Discharge Diagnoses:  Active Problems:   Abdominal pain affecting pregnancy   Discharged Condition: good  Hospital Course: Seen and examined, see HPI for details. No s/sx PTL, UTI, and fetal well being reassuring  Consults: None  Significant Diagnostic Studies: labs:  Results for orders placed or performed during the hospital encounter of 11/09/19  Urinalysis, Complete w Microscopic  Result Value Ref Range   Color, Urine YELLOW YELLOW   APPearance HAZY (A) CLEAR   Specific Gravity, Urine 1.020 1.005 - 1.030   pH 8.0 5.0 - 8.0   Glucose, UA NEGATIVE NEGATIVE mg/dL   Hgb urine dipstick TRACE (A) NEGATIVE   Bilirubin Urine NEGATIVE NEGATIVE   Ketones, ur NEGATIVE NEGATIVE mg/dL   Protein, ur TRACE (A) NEGATIVE mg/dL   Nitrite NEGATIVE NEGATIVE   Leukocytes,Ua NEGATIVE NEGATIVE   Squamous Epithelial / LPF 0-5 0 - 5   WBC, UA 0-5 0 - 5 WBC/hpf   RBC / HPF 0-5 0 - 5 RBC/hpf   Bacteria, UA MANY (A) NONE SEEN   Mucus PRESENT    A NST procedure was performed with FHR monitoring and a normal baseline established, appropriate time of 20-40 minutes of evaluation, and accels >2 seen w 15x15 characteristics.  Results show a REACTIVE NST.     Treatments: none  Discharge Exam: Blood pressure 126/76, pulse (!) 110, temperature 97.9 F (36.6 C), temperature source Oral. no change  Disposition: Discharge disposition: 01-Home or Self Care       Discharge Instructions    Call MD for:   Complete by: As directed    Worsening contractions or pain; leakage of fluid; bleeding.   Diet general   Complete by: As directed    Increase activity slowly   Complete by: As directed      Allergies as of 11/09/2019   No Known Allergies      Medication List    TAKE these medications   Bonjesta 20-20 MG Tbcr Generic drug: Doxylamine-Pyridoxine ER Take 1 tablet by mouth 2 (two) times daily.   Prenatal Vitamins 28-0.8 MG Tabs Take 1 tablet by mouth daily.        Signed: Letitia Libra 11/09/2019, 6:31 PM

## 2019-11-11 LAB — CULTURE, BETA STREP (GROUP B ONLY): Strep Gp B Culture: NEGATIVE

## 2019-11-14 ENCOUNTER — Encounter: Payer: Self-pay | Admitting: Obstetrics and Gynecology

## 2019-11-14 ENCOUNTER — Ambulatory Visit (INDEPENDENT_AMBULATORY_CARE_PROVIDER_SITE_OTHER): Payer: Managed Care, Other (non HMO)

## 2019-11-14 ENCOUNTER — Other Ambulatory Visit: Payer: Self-pay

## 2019-11-14 ENCOUNTER — Ambulatory Visit (INDEPENDENT_AMBULATORY_CARE_PROVIDER_SITE_OTHER): Payer: Managed Care, Other (non HMO) | Admitting: Obstetrics and Gynecology

## 2019-11-14 VITALS — BP 126/84 | Wt 148.0 lb

## 2019-11-14 DIAGNOSIS — O0993 Supervision of high risk pregnancy, unspecified, third trimester: Secondary | ICD-10-CM

## 2019-11-14 DIAGNOSIS — O09812 Supervision of pregnancy resulting from assisted reproductive technology, second trimester: Secondary | ICD-10-CM

## 2019-11-14 DIAGNOSIS — Z3A37 37 weeks gestation of pregnancy: Secondary | ICD-10-CM | POA: Diagnosis not present

## 2019-11-14 DIAGNOSIS — O09813 Supervision of pregnancy resulting from assisted reproductive technology, third trimester: Secondary | ICD-10-CM | POA: Diagnosis not present

## 2019-11-14 NOTE — Progress Notes (Signed)
Routine Prenatal Care Visit  Subjective  Maureen Black is a 29 y.o. G1P0000 at [redacted]w[redacted]d being seen today for ongoing prenatal care.  She is currently monitored for the following issues for this high-risk pregnancy and has Supervision of high risk pregnancy, antepartum; Pregnancy conceived through in vitro fertilization; and Abdominal pain affecting pregnancy on their problem list.  ----------------------------------------------------------------------------------- Patient reports no complaints.   Contractions: Not present. Vag. Bleeding: None.  Movement: Present. Leaking Fluid denies.  AFI normal today. SEe below. ----------------------------------------------------------------------------------- The following portions of the patient's history were reviewed and updated as appropriate: allergies, current medications, past family history, past medical history, past social history, past surgical history and problem list. Problem list updated.  Objective  Blood pressure 126/84, weight 148 lb (67.1 kg). Pregravid weight 130 lb (59 kg) Total Weight Gain 18 lb (8.165 kg) Urinalysis: Urine Protein    Urine Glucose    Fetal Status: Fetal Heart Rate (bpm): 130 Fundal Height: 37 cm Movement: Present  Presentation: Vertex  General:  Alert, oriented and cooperative. Patient is in no acute distress.  Skin: Skin is warm and dry. No rash noted.   Cardiovascular: Normal heart rate noted  Respiratory: Normal respiratory effort, no problems with respiration noted  Abdomen: Soft, gravid, appropriate for gestational age. Pain/Pressure: Present     Pelvic:  Cervical exam deferred        Extremities: Normal range of motion.  Edema: None  Mental Status: Normal mood and affect. Normal behavior. Normal judgment and thought content.   Imaging Results US OB Limited  Result Date: 11/14/2019 Patient Name: Maureen Black DOB: 04/03/1991 MRN: [id number] ULTRASOUND REPORT Location: [facility]  Date of Service: 11/14/2019 Indications:AFI Findings: Mason Jim intrauterine pregnancy is visualized with FHR at 146 BPM. Fetal presentation is Cephalic. Placenta: anterior. Grade: 3 AFI: 5.4 cm Impression: 1. [redacted]w[redacted]d Viable Singleton Intrauterine pregnancy dated by previously established criteria. 2. AFI is 5.4 cm. MVP > 2 cm. Deanna Artis, RT The ultrasound images and findings were reviewed by me and I agree with the above report. Thomasene Mohair, MD, [facility], [facility] 11/14/2019 9:58 AM       Assessment   29 y.o. G1P0000 at [redacted]w[redacted]d by  12/01/2019, Date entered prior to episode creation presenting for routine prenatal visit  Plan   FIRST Problems (from 05/01/19 to present)    Problem Noted Resolved   Supervision of high risk pregnancy, antepartum 05/01/2019 by Conard Novak, MD No   Overview Addendum 09/23/2019  9:38 AM by Vena Austria, MD    Clinic [facility] Prenatal Labs  Dating Embryo transfer Blood type: O+  Genetic Screen NIPS: normal XY Antibody: Negative  Anatomic [place] Normal Rubella: Immune Varicella: [ ]  needs  GTT 50 RPR: NR  Rhogam n/a HBsAg: neg  TDaP vaccine 09/23/2019  Flu Shot: HIV: negative  Baby Food                                GBS:   Contraception  Pap: NILM 1 year ago (05/2018)  CBB     CS/VBAC N/A   Support Person 06/2018 - husband           Pregnancy conceived through in vitro fertilization 05/01/2019 by 05/03/2019, MD No   Overview Addendum 08/12/2019 10:20 AM by 08/14/2019, MD    [x]  ECHO at 22 weeks [  ] Growth Nadara Mustard third trimester [  ]  IOL by 40 weeks          Term labor symptoms and general obstetric precautions including but not limited to vaginal bleeding, contractions, leaking of fluid and fetal movement were reviewed in detail with the patient. Please refer to After Visit Summary for other counseling recommendations.   - Discussed that she has never been diagnosed with oligohydramnios and based on  all ultrasound reports I have seen, she has had normal AFI.  Her level is slightly lower today, but still in the normal range. - IOL on 5/12 at 800 - Covid test on 5/10 between 9-10 AM at [place] - Growth/AFI with NST in 5 days.   Return in about 5 days (around 11/19/2019) for U/S for growth/afi and routine prenatal with NST.  Prentice Docker, MD, [facility], [facility] 11/14/2019 10:29 AM

## 2019-11-14 NOTE — Patient Instructions (Signed)
You are scheduled for an induction of labor on May 12 at 8 AM.  Go to the emergency room on this date at this time and let them know at the registration desk that you are there for an induction.  On May 10 between 9 and 10 AM [facility] for a Covid test.  Drive up to the circle at the entrance, stay in your car, wear a mask, and they will swab you for Covid.  You can leave after that. Please let me know if you have any questions

## 2019-11-19 ENCOUNTER — Ambulatory Visit (INDEPENDENT_AMBULATORY_CARE_PROVIDER_SITE_OTHER): Payer: Managed Care, Other (non HMO) | Admitting: Obstetrics and Gynecology

## 2019-11-19 ENCOUNTER — Other Ambulatory Visit: Payer: Self-pay | Admitting: Obstetrics and Gynecology

## 2019-11-19 ENCOUNTER — Encounter: Payer: Self-pay | Admitting: Obstetrics and Gynecology

## 2019-11-19 ENCOUNTER — Other Ambulatory Visit: Payer: Self-pay

## 2019-11-19 ENCOUNTER — Ambulatory Visit (INDEPENDENT_AMBULATORY_CARE_PROVIDER_SITE_OTHER): Payer: Managed Care, Other (non HMO)

## 2019-11-19 VITALS — BP 124/70 | Wt 148.0 lb

## 2019-11-19 DIAGNOSIS — O365990 Maternal care for other known or suspected poor fetal growth, unspecified trimester, not applicable or unspecified: Secondary | ICD-10-CM

## 2019-11-19 DIAGNOSIS — O09813 Supervision of pregnancy resulting from assisted reproductive technology, third trimester: Secondary | ICD-10-CM | POA: Diagnosis not present

## 2019-11-19 DIAGNOSIS — O0993 Supervision of high risk pregnancy, unspecified, third trimester: Secondary | ICD-10-CM

## 2019-11-19 DIAGNOSIS — O365930 Maternal care for other known or suspected poor fetal growth, third trimester, not applicable or unspecified: Secondary | ICD-10-CM

## 2019-11-19 DIAGNOSIS — Z3A38 38 weeks gestation of pregnancy: Secondary | ICD-10-CM

## 2019-11-19 NOTE — Progress Notes (Signed)
Routine Prenatal Care Visit  Subjective  Maureen Black is a 29 y.o. G1P0000 at [redacted]w[redacted]d being seen today for ongoing prenatal care.  She is currently monitored for the following issues for this high-risk pregnancy and has Supervision of high risk pregnancy, antepartum; Pregnancy conceived through in vitro fertilization; Abdominal pain affecting pregnancy; and Intrauterine growth restriction affecting antepartum care of mother on their problem list.  ----------------------------------------------------------------------------------- Patient reports no complaints.   Contractions: Not present. Vag. Bleeding: None.  Movement: Present. Leaking Fluid denies.  Growth u/s: EFW 8th %ile, AFI nml, UA dopplers normal  ----------------------------------------------------------------------------------- The following portions of the patient's history were reviewed and updated as appropriate: allergies, current medications, past family history, past medical history, past social history, past surgical history and problem list. Problem list updated.  Objective  Blood pressure 124/70, weight 148 lb (67.1 kg). Pregravid weight 130 lb (59 kg) Total Weight Gain 18 lb (8.165 kg) Urinalysis: Urine Protein    Urine Glucose    Fetal Status: Fetal Heart Rate (bpm): 130   Movement: Present  Presentation: Vertex  General:  Alert, oriented and cooperative. Patient is in no acute distress.  Skin: Skin is warm and dry. No rash noted.   Cardiovascular: Normal heart rate noted  Respiratory: Normal respiratory effort, no problems with respiration noted  Abdomen: Soft, gravid, appropriate for gestational age. Pain/Pressure: Present     Pelvic:  Cervical exam deferred        Extremities: Normal range of motion.  Edema: None  Mental Status: Normal mood and affect. Normal behavior. Normal judgment and thought content.   NST: Baseline FHR: 130 beats/min Variability: moderate Accelerations: present Decelerations:  absent Tocometry: not done  Interpretation:  INDICATIONS: intrauterine growth retardation RESULTS:  A NST procedure was performed with FHR monitoring and a normal baseline established, appropriate time of 20-40 minutes of evaluation, and accels >2 seen w 15x15 characteristics.  Results show a REACTIVE NST.    Imaging Results US OB Follow Up  Result Date: 11/19/2019 Patient Name: Maureen Black DOB: March 10, 1991 MRN: [id number] ULTRASOUND REPORT Location: [facility] Date of Service: 11/19/2019 Indications:growth/afi Findings: Mason Jim intrauterine pregnancy is visualized with FHR at 140 BPM. Biometrics give an (U/S) Gestational age of [redacted]w[redacted]d and an (U/S) EDD of 12/19/2019; this correlates with the clinically established Estimated Date of Delivery: 12/01/2019.  Fetal presentation is Cephalic. Placenta: anterior. Grade: 2 AFI: 9.8 cm Growth percentile is 8th%ile.  AC percentile is 8%. EFW: 2,681 g  (5 lb 15 oz) Umbilical Artery Dopplers were performed due to IUGR Systolic and Diastolic blood flow in each umbilical artery appear normal and without reversal or absence of diastolic flow. The maximum S/D ratio is 2.46. According to perinatology.com, this ratio is normal for this gestational age. Impression: 1. [redacted]w[redacted]d Viable Singleton Intrauterine pregnancy previously established criteria. 2. Growth is 8th %ile.  AFI is 9.8 cm. 3. Normal umbilical artery dopplers. Deanna Artis, RT The ultrasound images and findings were reviewed by me and I agree with the above report. Thomasene Mohair, MD, [facility], [facility] 11/19/2019 3:15 PM       Assessment   29 y.o. G1P0000 at [redacted]w[redacted]d by  12/01/2019, Date entered prior to episode creation presenting for routine prenatal visit  Plan   FIRST Problems (from 05/01/19 to present)    Problem Noted Resolved   Intrauterine growth restriction affecting antepartum care of mother 11/19/2019 by Conard Novak, MD No   Supervision of high  risk pregnancy, antepartum  05/01/2019 by Will Bonnet, MD No   Overview Addendum 09/23/2019  9:38 AM by Malachy Mood, MD    Clinic [facility] Prenatal Labs  Dating Embryo transfer Blood type: O+  Genetic Screen NIPS: normal XY Antibody: Negative  Anatomic [place] Normal Rubella: Immune Varicella: [ ]  needs  GTT 50 RPR: NR  Rhogam n/a HBsAg: neg  TDaP vaccine 09/23/2019  Flu Shot: HIV: negative  Baby Food                                GBS:   Contraception  Pap: NILM 1 year ago (05/2018)  CBB     CS/VBAC N/A   Support Person Jiles Prows - husband           Pregnancy conceived through in vitro fertilization 05/01/2019 by Will Bonnet, MD No   Overview Addendum 08/12/2019 10:20 AM by Gae Dry, MD    [x]  ECHO at 22 weeks [  ] Growth [place] third trimester [  ] IOL by 40 weeks          Term labor symptoms and general obstetric precautions including but not limited to vaginal bleeding, contractions, leaking of fluid and fetal movement were reviewed in detail with the patient. Please refer to After Visit Summary for other counseling recommendations.   - IOL changed to 5/9 at 0800 (per ACOG, deliver between [redacted]w[redacted]d and [redacted]w[redacted]d.    Return in about 2 days (around 11/21/2019) for NST.  Prentice Docker, MD, [facility], [facility] 11/19/2019 3:46 PM

## 2019-11-20 ENCOUNTER — Other Ambulatory Visit
Admission: RE | Admit: 2019-11-20 | Discharge: 2019-11-20 | Disposition: A | Discharge status: Home / Self Care | Payer: Managed Care, Other (non HMO) | Source: Ambulatory Visit | Attending: Obstetrics and Gynecology | Admitting: Obstetrics and Gynecology

## 2019-11-20 DIAGNOSIS — Z01812 Encounter for preprocedural laboratory examination: Secondary | ICD-10-CM | POA: Insufficient documentation

## 2019-11-20 DIAGNOSIS — Z20822 Contact with and (suspected) exposure to covid-19: Secondary | ICD-10-CM | POA: Insufficient documentation

## 2019-11-20 LAB — SARS CORONAVIRUS 2 (TAT 6-24 HRS): SARS Coronavirus 2: NEGATIVE

## 2019-11-21 ENCOUNTER — Other Ambulatory Visit: Payer: Self-pay

## 2019-11-21 ENCOUNTER — Encounter: Payer: Self-pay | Admitting: Obstetrics and Gynecology

## 2019-11-21 ENCOUNTER — Ambulatory Visit (INDEPENDENT_AMBULATORY_CARE_PROVIDER_SITE_OTHER): Payer: Managed Care, Other (non HMO) | Admitting: Obstetrics and Gynecology

## 2019-11-21 VITALS — BP 128/70 | Wt 147.0 lb

## 2019-11-21 DIAGNOSIS — Z3A38 38 weeks gestation of pregnancy: Secondary | ICD-10-CM | POA: Diagnosis not present

## 2019-11-21 DIAGNOSIS — O0990 Supervision of high risk pregnancy, unspecified, unspecified trimester: Secondary | ICD-10-CM

## 2019-11-21 LAB — FETAL NONSTRESS TEST

## 2019-11-21 LAB — POCT URINALYSIS DIPSTICK OB
Glucose, UA: NEGATIVE
POC,PROTEIN,UA: NEGATIVE

## 2019-11-21 NOTE — Patient Instructions (Addendum)
Pain Relief During Labor and Delivery Many things can cause pain during labor and delivery, including:  Pressure on bones and ligaments due to the baby moving through the pelvis.  Stretching of tissues due to the baby moving through the birth canal.  Muscle tension due to anxiety or nervousness.  The uterus tightening (contracting) and relaxing to help move the baby. There are many ways to deal with the pain of labor and delivery. They include:  Taking prenatal classes. Taking these classes helps you know what to expect during your baby's birth. What you learn will increase your confidence and decrease your anxiety.  Practicing relaxation techniques or doing relaxing activities, such as: ? Focused breathing. ? Meditation. ? Visualization. ? Aroma therapy. ? Listening to your favorite music. ? Hypnosis.  Taking a warm shower or bath (hydrotherapy). This may: ? Provide comfort and relaxation. ? Lessen your perception of pain. ? Decrease the amount of pain medicine needed. ? Decrease the length of labor.  Getting a massage or counterpressure on your back.  Applying warm packs or ice packs.  Changing positions often, moving around, or using a birthing ball.  Getting: ? Pain medicine through an IV or injection into a muscle. ? Pain medicine inserted into your spinal column. ? Injections of sterile water just under the skin on your lower back (intradermal injections). ? Laughing gas (nitrous oxide). Discuss your pain control options with your health care provider during your prenatal visits. Explore the options offered by your hospital or birth center. What kinds of medicine are available? There are two kinds of medicines that can be used to relieve pain during labor and delivery:  Analgesics. These medicines decrease pain without causing you to lose feeling or the ability to move your muscles.  Anesthetics. These medicines block feeling in the body and can decrease your  ability to move freely. Both of these kinds of medicine can cause minor side effects, such as nausea, trouble concentrating, and sleepiness. They can also decrease the baby's heart rate before birth and affect the baby's breathing rate after birth. For this reason, health care providers are careful about when and how much medicine is given. What are specific medicines and procedures that provide pain relief? Local Anesthetics Local anesthetics are used to numb a small area of the body. They may be used along with another kind of anesthetic or used to numb the nerves of the vagina, cervix, and perineum during the second stage of labor. General Anesthetics General anesthetics cause you to lose consciousness so you do not feel pain. They are usually only used for an emergency cesarean delivery. General anesthetics are given through an IV tube and a mask. Pudendal Block A pudendal block is a form of local anesthetic. It may be used to relieve the pain associated with pushing or stretching of the perineum at the time of delivery or to further numb the perineum. A pudendal block is done by injecting numbing medicine through the vaginal wall into a nerve in the pelvis. Epidural Analgesia Epidural analgesia is given through a flexible IV catheter that is inserted into the lower back. Numbing medicine is delivered continuously to the area near your spinal column nerves (epidural space). After having this type of analgesia, you may be able to move your legs but you most likely will not be able to walk. Depending on the amount of medicine given, you may lose all feeling in the lower half of your body, or you may retain some level   of sensation, including the urge to push. Epidural analgesia can be used to provide pain relief for a vaginal birth. Spinal Block A spinal block is similar to epidural analgesia, but the medicine is injected into the spinal fluid instead of the epidural space. A spinal block is only given  once. It starts to relieve pain quickly, but the pain relief lasts only 1-6 hours. Spinal blocks can be used for cesarean deliveries. Combined Spinal-Epidural (CSE) Block A CSE block combines the effects of a spinal block and epidural analgesia. The spinal block works quickly to block all pain. The epidural analgesia provides continuous pain relief, even after the effects of the spinal block have worn off. This information is not intended to replace advice given to you by your health care provider. Make sure you discuss any questions you have with your health care provider. Document Revised: 06/15/2017 Document Reviewed: 11/24/2015 Elsevier Patient Education  2020 [employer] Vaginal Delivery  Vaginal delivery means that you give birth by pushing your baby out of your birth canal (vagina). A team of health care providers will help you before, during, and after vaginal delivery. Birth experiences are unique for every woman and every pregnancy, and birth experiences vary depending on where you choose to give birth. What happens when I arrive at the birth center or hospital? Once you are in labor and have been admitted into the hospital or birth center, your health care provider may:  Review your pregnancy history and any concerns that you have.  Insert an IV into one of your veins. This may be used to give you fluids and medicines.  Check your blood pressure, pulse, temperature, and heart rate (vital signs).  Check whether your bag of water (amniotic sac) has broken (ruptured).  Talk with you about your birth plan and discuss pain control options. Monitoring Your health care provider may monitor your contractions (uterine monitoring) and your baby's heart rate (fetal monitoring). You may need to be monitored:  Often, but not continuously (intermittently).  All the time or for long periods at a time (continuously). Continuous monitoring may be needed if: ? You are taking certain medicines,  such as medicine to relieve pain or make your contractions stronger. ? You have pregnancy or labor complications. Monitoring may be done by:  Placing a special stethoscope or a handheld monitoring device on your abdomen to check your baby's heartbeat and to check for contractions.  Placing monitors on your abdomen (external monitors) to record your baby's heartbeat and the frequency and length of contractions.  Placing monitors inside your uterus through your vagina (internal monitors) to record your baby's heartbeat and the frequency, length, and strength of your contractions. Depending on the type of monitor, it may remain in your uterus or on your baby's head until birth.  Telemetry. This is a type of continuous monitoring that can be done with external or internal monitors. Instead of having to stay in bed, you are able to move around during telemetry. Physical exam Your health care provider may perform frequent physical exams. This may include:  Checking how and where your baby is positioned in your uterus.  Checking your cervix to determine: ? Whether it is thinning out (effacing). ? Whether it is opening up (dilating). What happens during labor and delivery?  Normal labor and delivery is divided into the following three stages: Stage 1  This is the longest stage of labor.  This stage can last for hours or days.  Throughout this stage,   you will feel contractions. Contractions generally feel mild, infrequent, and irregular at first. They get stronger, more frequent (about every 2-3 minutes), and more regular as you move through this stage.  This stage ends when your cervix is completely dilated to 4 inches (10 cm) and completely effaced. Stage 2  This stage starts once your cervix is completely effaced and dilated and lasts until the delivery of your baby.  This stage may last from 20 minutes to 2 hours.  This is the stage where you will feel an urge to push your baby out of  your vagina.  You may feel stretching and burning pain, especially when the widest part of your baby's head passes through the vaginal opening (crowning).  Once your baby is delivered, the umbilical cord will be clamped and cut. This usually occurs after waiting a period of 1-2 minutes after delivery.  Your baby will be placed on your bare chest (skin-to-skin contact) in an upright position and covered with a warm blanket. Watch your baby for feeding cues, like rooting or sucking, and help the baby to your breast for his or her first feeding. Stage 3  This stage starts immediately after the birth of your baby and ends after you deliver the placenta.  This stage may take anywhere from 5 to 30 minutes.  After your baby has been delivered, you will feel contractions as your body expels the placenta and your uterus contracts to control bleeding. What can I expect after labor and delivery?  After labor is over, you and your baby will be monitored closely until you are ready to go home to ensure that you are both healthy. Your health care team will teach you how to care for yourself and your baby.  You and your baby will stay in the same room (rooming in) during your hospital stay. This will encourage early bonding and successful breastfeeding.  You may continue to receive fluids and medicines through an IV.  Your uterus will be checked and massaged regularly (fundal massage).  You will have some soreness and pain in your abdomen, vagina, and the area of skin between your vaginal opening and your anus (perineum).  If an incision was made near your vagina (episiotomy) or if you had some vaginal tearing during delivery, cold compresses may be placed on your episiotomy or your tear. This helps to reduce pain and swelling.  You may be given a squirt bottle to use instead of wiping when you go to the bathroom. To use the squirt bottle, follow these steps: ? Before you urinate, fill the squirt  bottle with warm water. Do not use hot water. ? After you urinate, while you are sitting on the toilet, use the squirt bottle to rinse the area around your urethra and vaginal opening. This rinses away any urine and blood. ? Fill the squirt bottle with clean water every time you use the bathroom.  It is normal to have vaginal bleeding after delivery. Wear a sanitary pad for vaginal bleeding and discharge. Summary  Vaginal delivery means that you will give birth by pushing your baby out of your birth canal (vagina).  Your health care provider may monitor your contractions (uterine monitoring) and your baby's heart rate (fetal monitoring).  Your health care provider may perform a physical exam.  Normal labor and delivery is divided into three stages.  After labor is over, you and your baby will be monitored closely until you are ready to go home. This information   is not intended to replace advice given to you by your health care provider. Make sure you discuss any questions you have with your health care provider. Document Revised: 08/07/2017 Document Reviewed: 08/07/2017 Elsevier Patient Education  2020 [employer] Labor Induction  Labor induction is when steps are taken to cause a pregnant woman to begin the labor process. Most women go into labor on their own between 37 weeks and 42 weeks of pregnancy. When this does not happen or when there is a medical need for labor to begin, steps may be taken to induce labor. Labor induction causes a pregnant woman's uterus to contract. It also causes the cervix to soften (ripen), open (dilate), and thin out (efface). Usually, labor is not induced before 39 weeks of pregnancy unless there is a medical reason to do so. Your health care provider will determine if labor induction is needed. Before inducing labor, your health care provider will consider a number of factors, including:  Your medical condition and your baby's.  How many weeks along you  are in your pregnancy.  How mature your baby's lungs are.  The condition of your cervix.  The position of your baby.  The size of your birth canal. What are some reasons for labor induction? Labor may be induced if:  Your health or your baby's health is at risk.  Your pregnancy is overdue by 1 week or more.  Your water breaks but labor does not start on its own.  There is a low amount of amniotic fluid around your baby. You may also choose (elect) to have labor induced at a certain time. Generally, elective labor induction is done no earlier than 39 weeks of pregnancy. What methods are used for labor induction? Methods used for labor induction include:  Prostaglandin medicine. This medicine starts contractions and causes the cervix to dilate and ripen. It can be taken by mouth (orally) or by being inserted into the vagina (suppository).  Inserting a small, thin tube (catheter) with a balloon into the vagina and then expanding the balloon with water to dilate the cervix.  Stripping the membranes. In this method, your health care provider gently separates amniotic sac tissue from the cervix. This causes the cervix to stretch, which in turn causes the release of a hormone called progesterone. The hormone causes the uterus to contract. This procedure is often done during an office visit, after which you will be sent home to wait for contractions to begin.  Breaking the water. In this method, your health care provider uses a small instrument to make a small hole in the amniotic sac. This eventually causes the amniotic sac to break. Contractions should begin after a few hours.  Medicine to trigger or strengthen contractions. This medicine is given through an IV that is inserted into a vein in your arm. Except for membrane stripping, which can be done in a clinic, labor induction is done in the hospital so that you and your baby can be carefully monitored. How long does it take for labor to  be induced? The length of time it takes to induce labor depends on how ready your body is for labor. Some inductions can take up to 2-3 days, while others may take less than a day. Induction may take longer if:  You are induced early in your pregnancy.  It is your first pregnancy.  Your cervix is not ready. What are some risks associated with labor induction? Some risks associated with labor induction include:    Changes in fetal heart rate, such as being too high, too low, or irregular (erratic).  Failed induction.  Infection in the mother or the baby.  Increased risk of having a cesarean delivery.  Fetal death.  Breaking off (abruption) of the placenta from the uterus (rare).  Rupture of the uterus (very rare). When induction is needed for medical reasons, the benefits of induction generally outweigh the risks. What are some reasons for not inducing labor? Labor induction should not be done if:  Your baby does not tolerate contractions.  You have had previous surgeries on your uterus, such as a myomectomy, removal of fibroids, or a vertical scar from a previous cesarean delivery.  Your placenta lies very low in your uterus and blocks the opening of the cervix (placenta previa).  Your baby is not in a head-down position.  The umbilical cord drops down into the birth canal in front of the baby.  There are unusual circumstances, such as the baby being very early (premature).  You have had more than 2 previous cesarean deliveries. Summary  Labor induction is when steps are taken to cause a pregnant woman to begin the labor process.  Labor induction causes a pregnant woman's uterus to contract. It also causes the cervix to ripen, dilate, and efface.  Labor is not induced before 39 weeks of pregnancy unless there is a medical reason to do so.  When induction is needed for medical reasons, the benefits of induction generally outweigh the risks. This information is not  intended to replace advice given to you by your health care provider. Make sure you discuss any questions you have with your health care provider. Document Revised: 07/06/2017 Document Reviewed: 08/16/2016 Elsevier Patient Education  2020 [employer] Inc.  

## 2019-11-21 NOTE — Progress Notes (Signed)
Routine Prenatal Care Visit  Subjective  Maureen Black is a 29 y.o. G1P0000 at [redacted]w[redacted]d being seen today for ongoing prenatal care.  She is currently monitored for the following issues for this high-risk pregnancy and has Supervision of high risk pregnancy, antepartum; Pregnancy conceived through in vitro fertilization; Abdominal pain affecting pregnancy; and Intrauterine growth restriction affecting antepartum care of mother on their problem list.  ----------------------------------------------------------------------------------- Patient reports no complaints.   Contractions: Not present. Vag. Bleeding: None.  Movement: Present. Denies leaking of fluid.  ----------------------------------------------------------------------------------- The following portions of the patient's history were reviewed and updated as appropriate: allergies, current medications, past family history, past medical history, past social history, past surgical history and problem list. Problem list updated.   Objective  Blood pressure 128/70, weight 147 lb (66.7 kg). Pregravid weight 130 lb (59 kg) Total Weight Gain 17 lb (7.711 kg) Urinalysis:      Fetal Status:     Movement: Present     General:  Alert, oriented and cooperative. Patient is in no acute distress.  Skin: Skin is warm and dry. No rash noted.   Cardiovascular: Normal heart rate noted  Respiratory: Normal respiratory effort, no problems with respiration noted  Abdomen: Soft, gravid, appropriate for gestational age. Pain/Pressure: Present     Pelvic:  Cervical exam deferred        Extremities: Normal range of motion.     Mental Status: Normal mood and affect. Normal behavior. Normal judgment and thought content.   NST: 125 bpm baseline, moderate variability, 15x15 accelerations, no decelerations.  Assessment   29 y.o. G1P0000 at [redacted]w[redacted]d by  12/01/2019, Date entered prior to episode creation presenting for routine prenatal visit  Plan     FIRST Problems (from 05/01/19 to present)    Problem Noted Resolved   Intrauterine growth restriction affecting antepartum care of mother 11/19/2019 by Conard Novak, MD No   Supervision of high risk pregnancy, antepartum 05/01/2019 by Conard Novak, MD No   Overview Addendum 11/21/2019  9:29 AM by Natale Milch, MD    Clinic [facility] Prenatal Labs  Dating Embryo transfer Blood type: O+  Genetic Screen NIPS: normal XY Antibody: Negative  Anatomic [place] Normal Rubella: Immune Varicella: [ ]  needs  GTT 50 RPR: NR  Rhogam n/a HBsAg: neg  TDaP vaccine 09/23/2019  Flu Shot: HIV: negative  Baby Food                                11/23/2019-- (04/23 1640)  Contraception  Pap: NILM 1 year ago (05/2018)  CBB     CS/VBAC N/A   Support Person 06/2018 - husband           Pregnancy conceived through in vitro fertilization 05/01/2019 by 05/03/2019, MD No   Overview Addendum 08/12/2019 10:20 AM by 08/14/2019, MD    [x]  ECHO at 22 weeks [x  ] Growth Nadara Mustard third trimester [ x ] IOL by 40 weeks        Reviewed IUGR and indication for IOL.   Discussed IOL in detail with patient.  Gestational age appropriate obstetric precautions including but not limited to vaginal bleeding, contractions, leaking of fluid and fetal movement were reviewed in detail with the patient.    Return in about 1 week (around 11/28/2019) for ROB in person.  [place] MD [facility], [facility] 11/21/2019, 9:29 AM

## 2019-11-23 ENCOUNTER — Inpatient Hospital Stay: Payer: Managed Care, Other (non HMO) | Admitting: Anesthesiology

## 2019-11-23 ENCOUNTER — Other Ambulatory Visit: Payer: Self-pay

## 2019-11-23 ENCOUNTER — Inpatient Hospital Stay
Admission: RE | Admit: 2019-11-23 | Discharge: 2019-11-25 | DRG: 806 | Disposition: A | Discharge status: Home / Self Care | Payer: Managed Care, Other (non HMO) | Attending: Certified Nurse Midwife | Admitting: Certified Nurse Midwife

## 2019-11-23 ENCOUNTER — Encounter: Payer: Self-pay | Admitting: Obstetrics and Gynecology

## 2019-11-23 DIAGNOSIS — O99013 Anemia complicating pregnancy, third trimester: Secondary | ICD-10-CM | POA: Diagnosis not present

## 2019-11-23 DIAGNOSIS — O365930 Maternal care for other known or suspected poor fetal growth, third trimester, not applicable or unspecified: Principal | ICD-10-CM | POA: Diagnosis present

## 2019-11-23 DIAGNOSIS — Z3A39 39 weeks gestation of pregnancy: Secondary | ICD-10-CM | POA: Diagnosis not present

## 2019-11-23 DIAGNOSIS — D563 Thalassemia minor: Secondary | ICD-10-CM | POA: Diagnosis present

## 2019-11-23 DIAGNOSIS — F419 Anxiety disorder, unspecified: Secondary | ICD-10-CM | POA: Diagnosis present

## 2019-11-23 DIAGNOSIS — Z3A38 38 weeks gestation of pregnancy: Secondary | ICD-10-CM

## 2019-11-23 DIAGNOSIS — Z20822 Contact with and (suspected) exposure to covid-19: Secondary | ICD-10-CM | POA: Diagnosis present

## 2019-11-23 DIAGNOSIS — R000 Tachycardia, unspecified: Secondary | ICD-10-CM | POA: Diagnosis present

## 2019-11-23 DIAGNOSIS — O26893 Other specified pregnancy related conditions, third trimester: Secondary | ICD-10-CM | POA: Diagnosis present

## 2019-11-23 DIAGNOSIS — D62 Acute posthemorrhagic anemia: Secondary | ICD-10-CM | POA: Diagnosis not present

## 2019-11-23 DIAGNOSIS — O0990 Supervision of high risk pregnancy, unspecified, unspecified trimester: Secondary | ICD-10-CM

## 2019-11-23 DIAGNOSIS — O9081 Anemia of the puerperium: Secondary | ICD-10-CM | POA: Diagnosis not present

## 2019-11-23 DIAGNOSIS — Z3490 Encounter for supervision of normal pregnancy, unspecified, unspecified trimester: Secondary | ICD-10-CM | POA: Diagnosis present

## 2019-11-23 DIAGNOSIS — O99344 Other mental disorders complicating childbirth: Secondary | ICD-10-CM | POA: Diagnosis present

## 2019-11-23 DIAGNOSIS — O09813 Supervision of pregnancy resulting from assisted reproductive technology, third trimester: Secondary | ICD-10-CM

## 2019-11-23 DIAGNOSIS — D569 Thalassemia, unspecified: Secondary | ICD-10-CM | POA: Diagnosis not present

## 2019-11-23 DIAGNOSIS — O365990 Maternal care for other known or suspected poor fetal growth, unspecified trimester, not applicable or unspecified: Secondary | ICD-10-CM

## 2019-11-23 HISTORY — DX: Maternal care for other known or suspected poor fetal growth, third trimester, not applicable or unspecified: O36.5930

## 2019-11-23 LAB — CBC
HCT: 37 % (ref 36.0–46.0)
Hemoglobin: 11.5 g/dL — ABNORMAL LOW (ref 12.0–15.0)
MCH: 27.5 pg (ref 26.0–34.0)
MCHC: 31.1 g/dL (ref 30.0–36.0)
MCV: 88.5 fL (ref 80.0–100.0)
Platelets: 308 10*3/uL (ref 150–400)
RBC: 4.18 MIL/uL (ref 3.87–5.11)
RDW: 13.8 % (ref 11.5–15.5)
WBC: 7.4 10*3/uL (ref 4.0–10.5)
nRBC: 0 % (ref 0.0–0.2)

## 2019-11-23 LAB — ABO/RH: ABO/RH(D): O POS

## 2019-11-23 LAB — TYPE AND SCREEN
ABO/RH(D): O POS
Antibody Screen: NEGATIVE

## 2019-11-23 MED ORDER — SOD CITRATE-CITRIC ACID 500-334 MG/5ML PO SOLN: 30.0000 mL | ORAL | Status: DC | PRN

## 2019-11-23 MED ORDER — ONDANSETRON HCL 4 MG/2ML IJ SOLN
4.0000 mg | Freq: Four times a day (QID) | INTRAMUSCULAR | Status: DC | PRN
  Administered 2019-11-23: 22:00:00 4 mg via INTRAVENOUS
  Filled 2019-11-23: qty 2

## 2019-11-23 MED ORDER — OXYTOCIN 10 UNIT/ML IJ SOLN: 10.0000 [IU] | Freq: Once | INTRAMUSCULAR | Status: DC

## 2019-11-23 MED ORDER — OXYTOCIN 40 UNITS IN NORMAL SALINE INFUSION - SIMPLE MED
INTRAVENOUS | Status: AC
  Filled 2019-11-23: qty 1000

## 2019-11-23 MED ORDER — EPHEDRINE 5 MG/ML INJ: 10.0000 mg | INTRAVENOUS | Status: DC | PRN

## 2019-11-23 MED ORDER — OXYTOCIN 40 UNITS IN NORMAL SALINE INFUSION - SIMPLE MED
1.0000 m[IU]/min | INTRAVENOUS | Status: DC
  Administered 2019-11-23: 11:00:00 2 m[IU]/min via INTRAVENOUS

## 2019-11-23 MED ORDER — LACTATED RINGERS IV SOLN
INTRAVENOUS | Status: DC
  Administered 2019-11-23: 10:00:00 980 mL via INTRAVENOUS

## 2019-11-23 MED ORDER — OXYTOCIN 10 UNIT/ML IJ SOLN
INTRAMUSCULAR | Status: AC
  Filled 2019-11-23: qty 2

## 2019-11-23 MED ORDER — DIPHENHYDRAMINE HCL 50 MG/ML IJ SOLN: 12.5000 mg | INTRAMUSCULAR | Status: DC | PRN

## 2019-11-23 MED ORDER — TERBUTALINE SULFATE 1 MG/ML IJ SOLN: 0.2500 mg | Freq: Once | INTRAMUSCULAR | Status: DC | PRN

## 2019-11-23 MED ORDER — PHENYLEPHRINE 40 MCG/ML (10ML) SYRINGE FOR IV PUSH (FOR BLOOD PRESSURE SUPPORT): 80.0000 ug | PREFILLED_SYRINGE | INTRAVENOUS | Status: DC | PRN

## 2019-11-23 MED ORDER — LIDOCAINE HCL (PF) 1 % IJ SOLN: 30.0000 mL | INTRAMUSCULAR | Status: DC | PRN

## 2019-11-23 MED ORDER — SODIUM CHLORIDE 0.9 % IV SOLN
INTRAVENOUS | Status: DC | PRN
  Administered 2019-11-23 (×3): 5 mL via EPIDURAL

## 2019-11-23 MED ORDER — LACTATED RINGERS IV SOLN: 500.0000 mL | INTRAVENOUS | Status: DC | PRN

## 2019-11-23 MED ORDER — LACTATED RINGERS IV BOLUS
500.0000 mL | Freq: Once | INTRAVENOUS | Status: AC
  Administered 2019-11-23: 12:00:00 500 mL via INTRAVENOUS

## 2019-11-23 MED ORDER — AMMONIA AROMATIC IN INHA
RESPIRATORY_TRACT | Status: AC
  Filled 2019-11-23: qty 10

## 2019-11-23 MED ORDER — FENTANYL 2.5 MCG/ML W/ROPIVACAINE 0.15% IN NS 100 ML EPIDURAL (ARMC)
EPIDURAL | Status: AC
  Filled 2019-11-23: qty 100

## 2019-11-23 MED ORDER — FENTANYL 2.5 MCG/ML W/ROPIVACAINE 0.15% IN NS 100 ML EPIDURAL (ARMC)
12.0000 mL/h | EPIDURAL | Status: DC
  Administered 2019-11-23: 12 mL/h via EPIDURAL

## 2019-11-23 MED ORDER — MISOPROSTOL 25 MCG QUARTER TABLET
25.0000 ug | ORAL_TABLET | ORAL | Status: DC | PRN
  Filled 2019-11-23: qty 1

## 2019-11-23 MED ORDER — HYDROXYZINE HCL 25 MG PO TABS
25.0000 mg | ORAL_TABLET | ORAL | Status: AC
  Administered 2019-11-23: 10:00:00 25 mg via ORAL
  Filled 2019-11-23: qty 1

## 2019-11-23 MED ORDER — LIDOCAINE HCL (PF) 1 % IJ SOLN
INTRAMUSCULAR | Status: AC
  Filled 2019-11-23: qty 30

## 2019-11-23 MED ORDER — OXYTOCIN BOLUS FROM INFUSION
500.0000 mL | Freq: Once | INTRAVENOUS | Status: AC
  Administered 2019-11-24: 500 mL via INTRAVENOUS

## 2019-11-23 MED ORDER — LACTATED RINGERS IV SOLN: 500.0000 mL | Freq: Once | INTRAVENOUS | Status: DC

## 2019-11-23 MED ORDER — LIDOCAINE HCL (PF) 1 % IJ SOLN
INTRAMUSCULAR | Status: DC | PRN
  Administered 2019-11-23: 1 mL

## 2019-11-23 MED ORDER — MISOPROSTOL 200 MCG PO TABS
ORAL_TABLET | ORAL | Status: AC
  Filled 2019-11-23: qty 4

## 2019-11-23 MED ORDER — OXYTOCIN 40 UNITS IN NORMAL SALINE INFUSION - SIMPLE MED
2.5000 [IU]/h | INTRAVENOUS | Status: DC
  Administered 2019-11-24: 03:00:00 2.5 [IU]/h via INTRAVENOUS

## 2019-11-23 NOTE — Progress Notes (Signed)
Labor and Delivery Progress Note   S: Feeling better   O: BP 137/68 (BP Location: Right Arm)   Pulse 88   Temp 97.9 F (36.6 C) (Oral)   Resp 20   Ht 5\' 4"  (1.626 m)   Wt 66.7 kg Comment: 147lbs  BMI 25.23 kg/m    OCT was negative-Cat 1 tracing  Cervix: 1.5-2cm/ 80%/-1/vertex   A: Negative OCT Bishop score =9  P: Proceed with IOL with Pitocin Epidural when desired  , CNM

## 2019-11-23 NOTE — Anesthesia Procedure Notes (Signed)
Epidural Patient location during procedure: OB Start time: 11/23/2019 7:22 PM End time: 11/23/2019 7:39 PM  Staffing Anesthesiologist: Karleen Hampshire, MD Performed: anesthesiologist   Preanesthetic Checklist Completed: patient identified, IV checked, site marked, risks and benefits discussed, surgical consent, monitors and equipment checked, pre-op evaluation and timeout performed  Epidural Patient position: sitting Prep: ChloraPrep Patient monitoring: heart rate, continuous pulse ox and blood pressure Approach: midline Location: L3-L4 Injection technique: LOR saline  Needle:  Needle type: Tuohy  Needle gauge: 18 G Needle length: 9 cm and 9 Needle insertion depth: 5 cm Catheter type: closed end flexible Catheter size: 20 Guage Catheter at skin depth: 9 cm Test dose: negative and Other  Assessment Events: blood not aspirated, injection not painful, no injection resistance, no paresthesia and negative IV test  Additional Notes Risks and benefits of procedure discussed with patient.  Risks including but not limited to infection, spinal/epidural hematoma, nerve injury, post dural puncture headache, and inadequate/failed block.  Patient expressed understanding and consented to epidural placement. Negative dural puncture.  Negative aspiration.  Negative paresthesia on injection.  Dose given in divided aliquots.  Patient tolerated the procedure well with no immediate complications.   Reason for block:procedure for pain

## 2019-11-23 NOTE — Progress Notes (Signed)
OB History & Physical   History of Present Illness:  Chief Complaint:  Here for induction of labor HPI:  Maureen Black is a 29 y.o. G76P0000 female with EDC=12/01/2019 at [redacted]w[redacted]d dated by her embryo transfer date.  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 Her most recent EFW on 5/5 was 5#15oz or 8th%. .  NSTs have been reactive, Dopplers were normal. Her most recent AFI was 9.8cm, but have been as low as 5.4 cm.  She presents to L&D for an induction of labor. . On arrival she was tachycardic and feeling faint and anxious. Had not eaten breakfast this AM. Having irregular mild contractions. nio vaginal bleeding or leakage of water.    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 Abnormal Pap smear of cervix 2012  . Alpha thalassemia silent carrier   . Constipation     Past Surgical History:  Procedure Laterality Date  . DG SELECTED HSG GDC ONLY    . HYSTEROSALPINGOGRAM      No Known Allergies  Prior to Admission medications   Medication Sig Start Date End Date Taking? Authorizing Provider  BONJESTA 20-20 MG TBCR Take 1 tablet by mouth 2 (two) times daily. 08/12/19  Yes Gae Dry, MD  Prenatal Vit-Fe Fumarate-FA (PRENATAL VITAMINS) 28-0.8 MG TABS Take 1 tablet by mouth daily.   Yes  [provider]      Social History: She  reports that she has never smoked. She has never used smokeless tobacco. She reports previous alcohol use. She reports that she does not use drugs.  Family History: family history includes Diabetes in her maternal grandmother; Hypertension in her mother; Lung cancer in her paternal grandmother.   Review of Systems: Negative x 10 systems reviewed except as noted in the HPI.      Physical Exam:  Vital Signs: Ht 5\' 4"  (1.626 m)   Wt 66.7 kg Comment: 147lbs  BMI 25.23 kg/m  General: no acute distress.  HEENT: normocephalic, atraumatic Heart: tachycardic & normal  rhythm.  No murmurs/rubs/gallops Lungs: clear to auscultation bilaterally Abdomen: soft, gravid, non-tender;  EFW: 6# Pelvic:   External: Normal external female genitalia  Cervix:   Extremities: non-tender, symmetric, no edema bilaterally.  DTRs: +2  Neurologic: Alert & oriented x 3.   Baseline FHR: 130 baseline with accelerations to 150s, moderate variability Toco: occasional contraction  Bedside Ultrasound:  Cephalic/ LOT/ anterior placenta  Assessment:  Maureen Black is a 29 y.o. G1P0000 female at [redacted]w[redacted]d with IUGR for IOL.  Anxiety/ tachycardia FWB: Cat 1 tracing Plan:  1. Admit to Labor & Delivery for  IOL 1. Discussed with patient doing an OCT prior to Cytotec. If OCT negative-will proceed with induction 2. Given Atarax to help with anxiety and patient ate breakfast sandwich-after which patient felt much better 2. CBC, T&S,  IVF, RPR 3. GBS negative.   4. Consents obtained. 5. Breast and bottle  6. TDAP on 09/23/2019 7. O POS/ RI/VI 8. Contraception: none  Farrel Conners  11/23/2019 9:15 AM

## 2019-11-23 NOTE — Plan of Care (Signed)
Admission to L&D for IOL for IUGR.

## 2019-11-23 NOTE — Anesthesia Preprocedure Evaluation (Signed)
Anesthesia Evaluation  Patient identified by MRN, date of birth, ID band Patient awake    Reviewed: Allergy & Precautions, H&P , NPO status , Patient's Chart, lab work & pertinent test results  Airway Mallampati: II  TM Distance: >3 FB Neck ROM: full    Dental  (+) Teeth Intact   Pulmonary neg pulmonary ROS,           Cardiovascular Exercise Tolerance: Good (-) hypertensionnegative cardio ROS       Neuro/Psych negative neurological ROS  negative psych ROS   GI/Hepatic negative GI ROS, Neg liver ROS,   Endo/Other  negative endocrine ROS  Renal/GU   negative genitourinary   Musculoskeletal   Abdominal   Peds  Hematology negative hematology ROS (+)   Anesthesia Other Findings Past Medical History: 2012: Abnormal Pap smear of cervix No date: Alpha thalassemia silent carrier No date: Constipation  Past Surgical History: No date: DG SELECTED HSG GDC ONLY No date: HYSTEROSALPINGOGRAM  BMI    Body Mass Index: 25.23 kg/m      Reproductive/Obstetrics (+) Pregnancy                             Anesthesia Physical Anesthesia Plan  ASA: II  Anesthesia Plan: Epidural   Post-op Pain Management:    Induction:   PONV Risk Score and Plan:   Airway Management Planned:   Additional Equipment:   Intra-op Plan:   Post-operative Plan:   Informed Consent: I have reviewed the patients History and Physical, chart, labs and discussed the procedure including the risks, benefits and alternatives for the proposed anesthesia with the patient or authorized representative who has indicated his/her understanding and acceptance.       Plan Discussed with: Anesthesiologist  Anesthesia Plan Comments:         Anesthesia Quick Evaluation

## 2019-11-23 NOTE — Progress Notes (Signed)
Labor and Delivery Progress Note     S: Getting comfortable after epidural  O: Vital signs: 114/76 99.3-72-16 General: comfortable after epidural FHR:135 baseline with accelerations to 150s to 160, moderate variability Toco: contractions every 3 minutes apart on 10 miu/min Pitocin  Cervix: 3.5/ 80%/-1  A: Progressing FWB: Cat 1 tracing  P: AROM-small amount blood tinged amniotic fluid Continue Pitocin  Monitor progress and fetal well being  Farrel Conners, CNM

## 2019-11-24 ENCOUNTER — Encounter: Payer: Self-pay | Admitting: Obstetrics and Gynecology

## 2019-11-24 DIAGNOSIS — Z3490 Encounter for supervision of normal pregnancy, unspecified, unspecified trimester: Secondary | ICD-10-CM | POA: Diagnosis present

## 2019-11-24 DIAGNOSIS — Z3A39 39 weeks gestation of pregnancy: Secondary | ICD-10-CM

## 2019-11-24 DIAGNOSIS — D569 Thalassemia, unspecified: Secondary | ICD-10-CM

## 2019-11-24 DIAGNOSIS — O99013 Anemia complicating pregnancy, third trimester: Secondary | ICD-10-CM

## 2019-11-24 DIAGNOSIS — O09813 Supervision of pregnancy resulting from assisted reproductive technology, third trimester: Secondary | ICD-10-CM

## 2019-11-24 LAB — RPR: RPR Ser Ql: NONREACTIVE

## 2019-11-24 MED ORDER — OXYTOCIN 40 UNITS IN NORMAL SALINE INFUSION - SIMPLE MED
INTRAVENOUS | Status: AC
  Filled 2019-11-24: qty 1000

## 2019-11-24 MED ORDER — COCONUT OIL OIL: 1.0000 "application " | TOPICAL_OIL | Status: DC | PRN

## 2019-11-24 MED ORDER — ONDANSETRON HCL 4 MG/2ML IJ SOLN: 4.0000 mg | INTRAMUSCULAR | Status: DC | PRN

## 2019-11-24 MED ORDER — SENNOSIDES-DOCUSATE SODIUM 8.6-50 MG PO TABS
2.0000 | ORAL_TABLET | ORAL | Status: DC
  Administered 2019-11-25: 2 via ORAL
  Filled 2019-11-24: qty 2

## 2019-11-24 MED ORDER — PRENATAL MULTIVITAMIN CH
1.0000 | ORAL_TABLET | Freq: Every day | ORAL | Status: DC
  Administered 2019-11-24 – 2019-11-25 (×2): 1 via ORAL
  Filled 2019-11-24 (×2): qty 1

## 2019-11-24 MED ORDER — FERROUS SULFATE 325 (65 FE) MG PO TABS
325.0000 mg | ORAL_TABLET | Freq: Every day | ORAL | Status: DC
  Administered 2019-11-24 – 2019-11-25 (×2): 325 mg via ORAL
  Filled 2019-11-24 (×2): qty 1

## 2019-11-24 MED ORDER — WITCH HAZEL-GLYCERIN EX PADS
1.0000 "application " | MEDICATED_PAD | CUTANEOUS | Status: DC | PRN
  Filled 2019-11-24: qty 100

## 2019-11-24 MED ORDER — DIBUCAINE (PERIANAL) 1 % EX OINT
1.0000 "application " | TOPICAL_OINTMENT | CUTANEOUS | Status: DC | PRN
  Filled 2019-11-24: qty 28

## 2019-11-24 MED ORDER — IBUPROFEN 600 MG PO TABS
600.0000 mg | ORAL_TABLET | Freq: Four times a day (QID) | ORAL | Status: DC
  Administered 2019-11-24 – 2019-11-25 (×5): 600 mg via ORAL
  Filled 2019-11-24 (×6): qty 1

## 2019-11-24 MED ORDER — METHYLERGONOVINE MALEATE 0.2 MG/ML IJ SOLN
INTRAMUSCULAR | Status: AC
  Administered 2019-11-24: 0.2 mg
  Filled 2019-11-24: qty 1

## 2019-11-24 MED ORDER — SIMETHICONE 80 MG PO CHEW: 80.0000 mg | CHEWABLE_TABLET | ORAL | Status: DC | PRN

## 2019-11-24 MED ORDER — ACETAMINOPHEN 325 MG PO TABS
650.0000 mg | ORAL_TABLET | ORAL | Status: DC | PRN
  Filled 2019-11-24: qty 2

## 2019-11-24 MED ORDER — ONDANSETRON HCL 4 MG PO TABS: 4.0000 mg | ORAL_TABLET | ORAL | Status: DC | PRN

## 2019-11-24 MED ORDER — BENZOCAINE-MENTHOL 20-0.5 % EX AERO
1.0000 "application " | INHALATION_SPRAY | CUTANEOUS | Status: DC | PRN
  Administered 2019-11-24: 1 via TOPICAL
  Filled 2019-11-24: qty 56

## 2019-11-24 NOTE — Anesthesia Postprocedure Evaluation (Signed)
Anesthesia Post Note  Patient: Camera operator  Procedure(s) Performed: AN AD HOC LABOR EPIDURAL  Patient location during evaluation: Mother Baby Anesthesia Type: Epidural Level of consciousness: awake and alert and oriented Pain management: pain level controlled Vital Signs Assessment: post-procedure vital signs reviewed and stable Respiratory status: respiratory function stable Cardiovascular status: stable Postop Assessment: no headache, no backache, no apparent nausea or vomiting, able to ambulate, adequate PO intake and patient able to bend at knees Anesthetic complications: no     Last Vitals:  Vitals:   11/24/19 0717 11/24/19 0900  BP: 109/64 110/65  Pulse: 61 70  Resp: 18 20  Temp: 37.1 C 36.7 C  SpO2: 99% 98%    Last Pain:  Vitals:   11/24/19 0717  TempSrc: Oral  PainSc:                  Maureen Black

## 2019-11-24 NOTE — Discharge Summary (Signed)
Physician Obstetric Discharge Summary  Patient ID: Maureen Black MRN: [id number] DOB/AGE: [birth year] 29 y.o.   Date of Admission: 11/23/2019 Date of Delivery: 11/24/2019 Delivering Provider: Mirna Mires, CNM Date of Discharge: 11/25/2019  Admitting Diagnosis: Induction of labor at [redacted]w[redacted]d  Secondary Diagnosis: IUGR  Mode of Delivery: normal spontaneous vaginal delivery 11/24/2019      Discharge Diagnosis: Term intrauterine pregnancy-delivered   Intrapartum Procedures: Atificial rupture of membranes, epidural and Pitocin induction, repair of obstetric lacerations   Post partum procedures: NA  Complications: none   Brief Hospital Course  Maureen Black is a G1P0000 who had a SVD on 11/24/2019 after an IOL for suspected IUGR;  for further details of this delivery, please refer to the delivery note.  Patient had an uncomplicated postpartum course.  By time of discharge on PPD#1, her pain was controlled on oral pain medications; she had appropriate lochia and was ambulating, voiding without difficulty and tolerating regular diet.  She was deemed stable for discharge to home.    Labs: CBC Latest Ref Rng & Units 11/25/2019 11/23/2019 09/09/2019  WBC 4.0 - 10.5 K/uL 14.3(H) 7.4 13.9(H)  Hemoglobin 12.0 - 15.0 g/dL 10.4(L) 11.5(L) 11.7  Hematocrit 36.0 - 46.0 % 31.6(L) 37.0 35.6  Platelets 150 - 400 K/uL 261 308 308   O POS Performed at [facility], [address] [place], [place] [address]   Physical exam:  Blood pressure 108/76, pulse 78, temperature 98.9 F (37.2 C), temperature source Oral, resp. rate 20, height 5\' 4"  (1.626 m), weight 66.7 kg, SpO2 100 %, unknown if currently breastfeeding. General: alert and no distress Lochia: appropriate Abdomen: soft, NT Uterine Fundus: firm Incision:  2nd degree laceration healing well, no significant drainage, no dehiscence, no significant erythema Extremities: No evidence of DVT seen on physical exam. No  lower extremity edema.  Discharge Instructions: Per After Visit Summary. Activity: Advance as tolerated. Pelvic rest for 6 weeks.  Also refer to Discharge Instructions Diet: Regular Medications: Allergies as of 11/25/2019   No Known Allergies     Medication List    TAKE these medications   Bonjesta 20-20 MG Tbcr Generic drug: Doxylamine-Pyridoxine ER Take 1 tablet by mouth 2 (two) times daily.   ferrous sulfate 325 (65 FE) MG tablet Take 1 tablet (325 mg total) by mouth daily with breakfast. Start taking on: Nov 26, 2019   Prenatal Vitamins 28-0.8 MG Tabs Take 1 tablet by mouth daily.      Outpatient follow up:  Follow-up Information    Nov 28, 2019, CNM. Schedule an appointment as soon as possible for a visit.   Specialty: Certified Nurse Midwife Why: for a 6 week postpartum check up Contact information: [address] [place] [phone]          Postpartum contraception: no method, female infertility  Discharged Condition: good  Discharged to: home   Newborn Data:Elijah 6#4oz Disposition:home with mother  Apgars: APGAR (1 MIN): 8   APGAR (5 MINS): 9   APGAR (10 MINS):    Baby Feeding: Breast  [phone], CNM 11/25/2019 12:36 PM

## 2019-11-24 NOTE — H&P (Signed)
OB History & Physical   History of Present Illness:  Chief Complaint:  Here for induction of labor HPI:  Maureen Black is a 29 y.o. G82P0000 female with EDC=12/01/2019 at [redacted]w[redacted]d dated by her embryo transfer date.  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 Her most recent EFW on 5/5 was 5#15oz or 8th%. .  NSTs have been reactive, Dopplers were normal. Her most recent AFI was 9.8cm, but have been as low as 5.4 cm.  She presents to L&D for an induction of labor. . On arrival she was tachycardic and feeling faint and anxious. Had not eaten breakfast this AM. Having irregular mild contractions. nio vaginal bleeding or leakage of water.    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 Abnormal Pap smear of cervix 2012  . Alpha thalassemia silent carrier   . Constipation     Past Surgical History:  Procedure Laterality Date  . DG SELECTED HSG GDC ONLY    . HYSTEROSALPINGOGRAM      No Known Allergies  Prior to Admission medications   Medication Sig Start Date End Date Taking? Authorizing Provider  BONJESTA 20-20 MG TBCR Take 1 tablet by mouth 2 (two) times daily. 08/12/19  Yes 08/14/19, MD  Prenatal Vit-Fe Fumarate-FA (PRENATAL VITAMINS) 28-0.8 MG TABS Take 1 tablet by mouth daily.   Yes  [provider]      Social History: She  reports that she has never smoked. She has never used smokeless tobacco. She reports previous alcohol use. She reports that she does not use drugs.  Family History: family history includes Diabetes in her maternal grandmother; Hypertension in her mother; Lung cancer in her paternal grandmother.   Review of Systems: Negative x 10 systems reviewed except as noted in the HPI.      Physical Exam:  Vital Signs: BP 118/83   Pulse 85   Temp 98.5 F (36.9 C) (Axillary)   Resp 15   Ht 5\' 4"  (1.626 m)   Wt 66.7 kg Comment: 147lbs  SpO2 99%   Breastfeeding Unknown   BMI 25.23 kg/m  General: no acute distress.  HEENT: normocephalic, atraumatic Heart: tachycardic & normal  rhythm.  No murmurs/rubs/gallops Lungs: clear to auscultation bilaterally Abdomen: soft, gravid, non-tender;  EFW: 6# Pelvic:   External: Normal external female genitalia  Cervix:   Extremities: non-tender, symmetric, no edema bilaterally.  DTRs: +2  Neurologic: Alert & oriented x 3.   Baseline FHR: 130 baseline with accelerations to 150s, moderate variability Toco: occasional contraction  Bedside Ultrasound:  Cephalic/ LOT/ anterior placenta  Assessment:  Maureen Black  is a 29 y.o. G1P0000 female at [redacted]w[redacted]d with IUGR for IOL.  Anxiety/ tachycardia FWB: Cat 1 tracing Plan:  1. Admit to Labor & Delivery for IOL 1. Discussed with patient doing an OCT prior to Cytotec. If OCT negative-will proceed with induction 2. Given Atarax to help with anxiety and patient ate breakfast sandwich-after which patient felt much better 2. CBC, T&S,  IVF, RPR 3. GBS negative.   4. Consents obtained. 5. Breast and bottle  6. TDAP on 09/23/2019 7. O POS/ RI/VI 8. Contraception: none  Dalia Heading  11/24/2019 4:43 AM

## 2019-11-24 NOTE — Discharge Instructions (Signed)
°Vaginal Delivery, Care After °Refer to this sheet in the next few weeks. These discharge instructions provide you with information on caring for yourself after delivery. Your caregiver may also give you specific instructions. Your treatment has been planned according to the most current medical practices available, but problems sometimes occur. Call your caregiver if you have any problems or questions after you go home. °HOME CARE INSTRUCTIONS °1. Take over-the-counter or prescription medicines only as directed by your caregiver or pharmacist. °2. Do not drink alcohol, especially if you are breastfeeding or taking medicine to relieve pain. °3. Do not smoke tobacco. °4. Continue to use good perineal care. Good perineal care includes: °1. Wiping your perineum from back to front °2. Keeping your perineum clean. °3. You can do sitz baths twice a day, to help keep this area clean °5. Do not use tampons, douche or have sex for 6 weeks °6. Shower only and avoid sitting in submerged water, aside from sitz baths °7. Wear a well-fitting bra that provides breast support. °8. Eat healthy foods. °9. Drink enough fluids to keep your urine clear or pale yellow. °10. Eat high-fiber foods such as whole grain cereals and breads, brown rice, beans, and fresh fruits and vegetables every day. These foods may help prevent or relieve constipation. °11. Avoid constipation with high fiber foods or medications, such as miralax or metamucil °12. Follow your caregiver's recommendations regarding resumption of activities such as climbing stairs, driving, lifting, exercising, or traveling. °13. Talk to your caregiver about resuming sexual activities. Resumption of sexual activities after 6 weeks is dependent upon your risk of infection, your rate of healing, and your comfort and desire to resume sexual activity. °14. Try to have someone help you with your household activities and your newborn for at least a few days after you leave the  hospital. °15. Rest as much as possible. Try to rest or take a nap when your newborn is sleeping. °16. Increase your activities gradually. °17. Keep all of your scheduled postpartum appointments. It is very important to keep your scheduled follow-up appointments. At these appointments, your caregiver will be checking to make sure that you are healing physically and emotionally. °SEEK MEDICAL CARE IF:  °· You are passing large clots from your vagina. Save any clots to show your caregiver. °· You have a foul smelling discharge from your vagina. °· You have trouble urinating. °· You are urinating frequently. °· You have pain when you urinate. °· You have a change in your bowel movements. °· You have increasing redness, pain, or swelling near your vaginal incision (episiotomy) or vaginal tear. °· You have pus draining from your episiotomy or vaginal tear. °· Your episiotomy or vaginal tear is separating. °· You have painful, hard, or reddened breasts. °· You have a severe headache. °· You have blurred vision or see spots. °· You feel sad or depressed. °· You have thoughts of hurting yourself or your newborn. °· You have questions about your care, the care of your newborn, or medicines. °· You are dizzy or light-headed. °· You have a rash. °· You have nausea or vomiting. °· You were breastfeeding and have not had a menstrual period within 12 weeks after you stopped breastfeeding. °· You are not breastfeeding and have not had a menstrual period by the 12th week after delivery. °· You have a fever of 100.5 or more °SEEK IMMEDIATE MEDICAL CARE IF:  °· You have persistent pain. °· You have chest pain. °· You have shortness   of breath. °· You faint. °· You have leg pain. °· You have stomach pain. °· Your vaginal bleeding saturates two or more sanitary pads in 1 hour. °MAKE SURE YOU:  °· Understand these instructions. °· Will watch your condition. °· Will get help right away if you are not doing well or get worse. °Document  Released: 06/30/2000 Document Revised: 11/17/2013 Document Reviewed: 02/28/2012 °ExitCare® Patient Information ©2015 [employer]. This information is not intended to replace advice given to you by your health care provider. Make sure you discuss any questions you have with your health care provider. ° °Sitz Bath °A sitz bath is a warm water bath taken in the sitting position. The water covers only the hips and butt (buttocks). We recommend using one that fits in the toilet, to help with ease of use and cleanliness. It may be used for either healing or cleaning purposes. Sitz baths are also used to relieve pain, itching, or muscle tightening (spasms). The water may contain medicine. Moist heat will help you heal and relax.  °HOME CARE  °Take 3 to 4 sitz baths a day. °18. Fill the bathtub half-full with warm water. °19. Sit in the water and open the drain a little. °20. Turn on the warm water to keep the tub half-full. Keep the water running constantly. °21. Soak in the water for 15 to 20 minutes. °22. After the sitz bath, pat the affected area dry. °GET HELP RIGHT AWAY IF: °You get worse instead of better. Stop the sitz baths if you get worse. °MAKE SURE YOU: °· Understand these instructions. °· Will watch your condition. °· Will get help right away if you are not doing well or get worse. °Document Released: 08/10/2004 Document Revised: 03/27/2012 Document Reviewed: 10/31/2010 °ExitCare® Patient Information ©2015 [employer]. This information is not intended to replace advice given to you by your health care provider. Make sure you discuss any questions you have with your health care provider. ° ° °

## 2019-11-24 NOTE — Progress Notes (Signed)
   Subjective:  Doing well postpartum day 0, 7 hours: She is tolerating regular diet. Her pain is controlled with PO medications. She is ambulating and voiding without difficulty. She is waiting for breastfeeding assistance from [facility].  Objective:  Vital signs in last 24 hours: Temp:  [97.5 F (36.4 C)-99.3 F (37.4 C)] 98.8 F (37.1 C) (05/10 0717) Pulse Rate:  [61-123] 61 (05/10 0717) Resp:  [15-18] 18 (05/10 0717) BP: (99-131)/(55-94) 109/64 (05/10 0717) SpO2:  [99 %] 99 % (05/10 0717)    General: NAD Pulmonary: no increased work of breathing Abdomen: non-distended, non-tender, fundus firm at level of umbilicus Extremities: no edema, no erythema, no tenderness  Results for orders placed or performed during the hospital encounter of 11/23/19 (from the past 72 hour(s))  CBC     Status: Abnormal   Collection Time: 11/23/19 10:07 AM  Result Value Ref Range   WBC 7.4 4.0 - 10.5 K/uL   RBC 4.18 3.87 - 5.11 MIL/uL   Hemoglobin 11.5 (L) 12.0 - 15.0 g/dL   HCT 15.1 76.1 - 60.7 %   MCV 88.5 80.0 - 100.0 fL   MCH 27.5 26.0 - 34.0 pg   MCHC 31.1 30.0 - 36.0 g/dL   RDW 37.1 06.2 - 69.4 %   Platelets 308 150 - 400 K/uL   nRBC 0.0 0.0 - 0.2 %    Comment: Performed at [facility], [address] [place], [place] [address]  Type and screen     Status: None   Collection Time: 11/23/19 10:07 AM  Result Value Ref Range   ABO/RH(D) O POS    Antibody Screen NEG    Sample Expiration      11/26/2019,2359 Performed at [facility], [address], [place], [place] [address]   ABO/Rh     Status: None   Collection Time: 11/23/19 11:27 AM  Result Value Ref Range   ABO/RH(D)      O POS Performed at [facility], [address], [place], [place] [address]     Assessment:   29 y.o. G1P1001 postpartum day # 0, 7 hours, lactating  Plan:    1) Acute blood loss anemia - hemodynamically stable and asymptomatic - po ferrous sulfate  2) Blood Type --/--/O  POS Performed at [facility], [address] [place], [place] [address]  (05/09 1127) / Rubella   / Varicella Immune  3) TDAP status up to date  4) Feeding plan Breast/formula  5)  Education given regarding options for contraception, as well as compatibility with breast feeding if applicable.  Patient plans on none for contraception.  6) Disposition: continue current care   Tresea Mall, CNM [facility] 11/24/2019, 9:35 AM

## 2019-11-25 LAB — CBC
HCT: 31.6 % — ABNORMAL LOW (ref 36.0–46.0)
Hemoglobin: 10.4 g/dL — ABNORMAL LOW (ref 12.0–15.0)
MCH: 28.1 pg (ref 26.0–34.0)
MCHC: 32.9 g/dL (ref 30.0–36.0)
MCV: 85.4 fL (ref 80.0–100.0)
Platelets: 261 10*3/uL (ref 150–400)
RBC: 3.7 MIL/uL — ABNORMAL LOW (ref 3.87–5.11)
RDW: 13.6 % (ref 11.5–15.5)
WBC: 14.3 10*3/uL — ABNORMAL HIGH (ref 4.0–10.5)
nRBC: 0 % (ref 0.0–0.2)

## 2019-11-25 MED ORDER — FERROUS SULFATE 325 (65 FE) MG PO TABS: 325.0000 mg | ORAL_TABLET | Freq: Every day | ORAL | 1 refills | Status: DC

## 2019-11-25 MED ORDER — IBUPROFEN 600 MG PO TABS: 600.0000 mg | ORAL_TABLET | Freq: Four times a day (QID) | ORAL | Status: DC

## 2019-11-25 NOTE — Progress Notes (Signed)
Patient discharged home with infant. Discharge instructions, prescriptions and follow up appointment given to and reviewed with patient. Patient verbalized understanding  

## 2019-12-16 ENCOUNTER — Emergency Department: Payer: Managed Care, Other (non HMO) | Admitting: Anesthesiology

## 2019-12-16 ENCOUNTER — Observation Stay
Admission: EM | Admit: 2019-12-16 | Discharge: 2019-12-17 | Disposition: A | Discharge status: Home / Self Care | Payer: Managed Care, Other (non HMO) | Attending: Surgery | Admitting: Surgery

## 2019-12-16 ENCOUNTER — Emergency Department: Payer: Managed Care, Other (non HMO)

## 2019-12-16 ENCOUNTER — Encounter: Payer: Self-pay | Admitting: Emergency Medicine

## 2019-12-16 ENCOUNTER — Encounter
Admission: EM | Disposition: A | Discharge status: Home / Self Care | Payer: Self-pay | Source: Home / Self Care | Attending: Emergency Medicine

## 2019-12-16 ENCOUNTER — Other Ambulatory Visit: Payer: Self-pay

## 2019-12-16 DIAGNOSIS — O9089 Other complications of the puerperium, not elsewhere classified: Secondary | ICD-10-CM | POA: Diagnosis not present

## 2019-12-16 DIAGNOSIS — Z148 Genetic carrier of other disease: Secondary | ICD-10-CM | POA: Diagnosis not present

## 2019-12-16 DIAGNOSIS — K819 Cholecystitis, unspecified: Secondary | ICD-10-CM | POA: Diagnosis not present

## 2019-12-16 DIAGNOSIS — R1013 Epigastric pain: Secondary | ICD-10-CM

## 2019-12-16 DIAGNOSIS — Z79899 Other long term (current) drug therapy: Secondary | ICD-10-CM | POA: Insufficient documentation

## 2019-12-16 DIAGNOSIS — Z20822 Contact with and (suspected) exposure to covid-19: Secondary | ICD-10-CM | POA: Diagnosis not present

## 2019-12-16 DIAGNOSIS — K8010 Calculus of gallbladder with chronic cholecystitis without obstruction: Secondary | ICD-10-CM | POA: Insufficient documentation

## 2019-12-16 DIAGNOSIS — K810 Acute cholecystitis: Secondary | ICD-10-CM | POA: Diagnosis present

## 2019-12-16 DIAGNOSIS — R52 Pain, unspecified: Secondary | ICD-10-CM

## 2019-12-16 DIAGNOSIS — K439 Ventral hernia without obstruction or gangrene: Secondary | ICD-10-CM | POA: Insufficient documentation

## 2019-12-16 DIAGNOSIS — O9963 Diseases of the digestive system complicating the puerperium: Principal | ICD-10-CM | POA: Insufficient documentation

## 2019-12-16 LAB — CBC WITH DIFFERENTIAL/PLATELET
Abs Immature Granulocytes: 0.02 10*3/uL (ref 0.00–0.07)
Basophils Absolute: 0 10*3/uL (ref 0.0–0.1)
Basophils Relative: 0 %
Eosinophils Absolute: 0 10*3/uL (ref 0.0–0.5)
Eosinophils Relative: 0 %
HCT: 41.6 % (ref 36.0–46.0)
Hemoglobin: 13 g/dL (ref 12.0–15.0)
Immature Granulocytes: 0 %
Lymphocytes Relative: 16 %
Lymphs Abs: 1.2 10*3/uL (ref 0.7–4.0)
MCH: 27.4 pg (ref 26.0–34.0)
MCHC: 31.3 g/dL (ref 30.0–36.0)
MCV: 87.8 fL (ref 80.0–100.0)
Monocytes Absolute: 0.3 10*3/uL (ref 0.1–1.0)
Monocytes Relative: 4 %
Neutro Abs: 6.1 10*3/uL (ref 1.7–7.7)
Neutrophils Relative %: 80 %
Platelets: 418 10*3/uL — ABNORMAL HIGH (ref 150–400)
RBC: 4.74 MIL/uL (ref 3.87–5.11)
RDW: 13.2 % (ref 11.5–15.5)
WBC: 7.6 10*3/uL (ref 4.0–10.5)
nRBC: 0 % (ref 0.0–0.2)

## 2019-12-16 LAB — COMPREHENSIVE METABOLIC PANEL
ALT: 31 U/L (ref 0–44)
AST: 36 U/L (ref 15–41)
Albumin: 4 g/dL (ref 3.5–5.0)
Alkaline Phosphatase: 73 U/L (ref 38–126)
Anion gap: 10 (ref 5–15)
BUN: 10 mg/dL (ref 6–20)
CO2: 26 mmol/L (ref 22–32)
Calcium: 9.3 mg/dL (ref 8.9–10.3)
Chloride: 106 mmol/L (ref 98–111)
Creatinine, Ser: 0.66 mg/dL (ref 0.44–1.00)
GFR calc Af Amer: >60 mL/min (ref >60)
GFR calc non Af Amer: >60 mL/min (ref >60)
Glucose, Bld: 111 mg/dL — ABNORMAL HIGH (ref 70–99)
Potassium: 3.8 mmol/L (ref 3.5–5.1)
Sodium: 142 mmol/L (ref 135–145)
Total Bilirubin: 0.6 mg/dL (ref 0.3–1.2)
Total Protein: 7 g/dL (ref 6.5–8.1)

## 2019-12-16 LAB — URINALYSIS, COMPLETE (UACMP) WITH MICROSCOPIC
Bilirubin Urine: NEGATIVE
Glucose, UA: NEGATIVE mg/dL
Ketones, ur: NEGATIVE mg/dL
Nitrite: NEGATIVE
Protein, ur: NEGATIVE mg/dL
Specific Gravity, Urine: 1.015 (ref 1.005–1.030)
pH: 8 (ref 5.0–8.0)

## 2019-12-16 LAB — LIPASE, BLOOD: Lipase: 31 U/L (ref 11–51)

## 2019-12-16 LAB — POCT PREGNANCY, URINE: Preg Test, Ur: NEGATIVE

## 2019-12-16 LAB — TROPONIN I (HIGH SENSITIVITY): Troponin I (High Sensitivity): 3 ng/L (ref <18)

## 2019-12-16 LAB — SARS CORONAVIRUS 2 BY RT PCR (HOSPITAL ORDER, PERFORMED IN ~~LOC~~ HOSPITAL LAB): SARS Coronavirus 2: NEGATIVE

## 2019-12-16 SURGERY — CHOLECYSTECTOMY, ROBOT-ASSISTED, LAPAROSCOPIC
Anesthesia: General

## 2019-12-16 MED ORDER — ACETAMINOPHEN 10 MG/ML IV SOLN
INTRAVENOUS | Status: AC
  Filled 2019-12-16: qty 100

## 2019-12-16 MED ORDER — SODIUM CHLORIDE 0.9 % IV SOLN: INTRAVENOUS | Status: DC

## 2019-12-16 MED ORDER — FENTANYL CITRATE (PF) 100 MCG/2ML IJ SOLN
25.0000 ug | INTRAMUSCULAR | Status: DC | PRN
  Administered 2019-12-16 (×3): 25 ug via INTRAVENOUS

## 2019-12-16 MED ORDER — ROCURONIUM BROMIDE 10 MG/ML (PF) SYRINGE
PREFILLED_SYRINGE | INTRAVENOUS | Status: AC
  Filled 2019-12-16: qty 10

## 2019-12-16 MED ORDER — KETOROLAC TROMETHAMINE 30 MG/ML IJ SOLN: 30.0000 mg | Freq: Four times a day (QID) | INTRAMUSCULAR | Status: DC | PRN

## 2019-12-16 MED ORDER — PANTOPRAZOLE SODIUM 40 MG IV SOLR
40.0000 mg | Freq: Every day | INTRAVENOUS | Status: DC
  Administered 2019-12-16: 40 mg via INTRAVENOUS
  Filled 2019-12-16: qty 40

## 2019-12-16 MED ORDER — FENTANYL CITRATE (PF) 100 MCG/2ML IJ SOLN
INTRAMUSCULAR | Status: AC
  Administered 2019-12-16: 25 ug via INTRAVENOUS
  Filled 2019-12-16: qty 2

## 2019-12-16 MED ORDER — LIDOCAINE HCL (PF) 2 % IJ SOLN
INTRAMUSCULAR | Status: AC
  Filled 2019-12-16: qty 5

## 2019-12-16 MED ORDER — ACETAMINOPHEN 10 MG/ML IV SOLN
INTRAVENOUS | Status: DC | PRN
  Administered 2019-12-16: 1000 mg via INTRAVENOUS

## 2019-12-16 MED ORDER — MIDAZOLAM HCL 2 MG/2ML IJ SOLN
INTRAMUSCULAR | Status: AC
  Filled 2019-12-16: qty 4

## 2019-12-16 MED ORDER — DIPHENHYDRAMINE HCL 12.5 MG/5ML PO ELIX
12.5000 mg | ORAL_SOLUTION | Freq: Four times a day (QID) | ORAL | Status: DC | PRN
  Filled 2019-12-16: qty 5

## 2019-12-16 MED ORDER — SODIUM CHLORIDE 0.9 % IV SOLN
2.0000 g | INTRAVENOUS | Status: DC
  Administered 2019-12-16: 2 g via INTRAVENOUS
  Filled 2019-12-16 (×2): qty 20

## 2019-12-16 MED ORDER — SODIUM CHLORIDE 0.9 % IV BOLUS
1000.0000 mL | Freq: Once | INTRAVENOUS | Status: AC
  Administered 2019-12-16: 1000 mL via INTRAVENOUS

## 2019-12-16 MED ORDER — FENTANYL CITRATE (PF) 100 MCG/2ML IJ SOLN
INTRAMUSCULAR | Status: AC
  Filled 2019-12-16: qty 2

## 2019-12-16 MED ORDER — BUPIVACAINE-EPINEPHRINE (PF) 0.25% -1:200000 IJ SOLN
INTRAMUSCULAR | Status: DC | PRN
  Administered 2019-12-16: 30 mL

## 2019-12-16 MED ORDER — ONDANSETRON HCL 4 MG/2ML IJ SOLN
INTRAMUSCULAR | Status: DC | PRN
  Administered 2019-12-16: 4 mg via INTRAVENOUS

## 2019-12-16 MED ORDER — FENTANYL CITRATE (PF) 100 MCG/2ML IJ SOLN
50.0000 ug | INTRAMUSCULAR | Status: DC | PRN
  Administered 2019-12-16: 50 ug via INTRAVENOUS
  Filled 2019-12-16: qty 2

## 2019-12-16 MED ORDER — SODIUM CHLORIDE 0.9 % IV SOLN
2.0000 g | INTRAVENOUS | Status: AC
  Administered 2019-12-17: 2 g via INTRAVENOUS
  Filled 2019-12-16: qty 2

## 2019-12-16 MED ORDER — ENOXAPARIN SODIUM 40 MG/0.4ML ~~LOC~~ SOLN
40.0000 mg | SUBCUTANEOUS | Status: DC
  Filled 2019-12-16: qty 0.4

## 2019-12-16 MED ORDER — GADOBUTROL 1 MMOL/ML IV SOLN
5.0000 mL | Freq: Once | INTRAVENOUS | Status: AC | PRN
  Administered 2019-12-16: 5 mL via INTRAVENOUS

## 2019-12-16 MED ORDER — PROPOFOL 10 MG/ML IV BOLUS
INTRAVENOUS | Status: AC
  Filled 2019-12-16: qty 20

## 2019-12-16 MED ORDER — LIDOCAINE HCL (CARDIAC) PF 100 MG/5ML IV SOSY
PREFILLED_SYRINGE | INTRAVENOUS | Status: DC | PRN
  Administered 2019-12-16: 60 mg via INTRAVENOUS

## 2019-12-16 MED ORDER — FENTANYL CITRATE (PF) 250 MCG/5ML IJ SOLN
INTRAMUSCULAR | Status: AC
  Filled 2019-12-16: qty 5

## 2019-12-16 MED ORDER — SUCCINYLCHOLINE CHLORIDE 200 MG/10ML IV SOSY
PREFILLED_SYRINGE | INTRAVENOUS | Status: AC
  Filled 2019-12-16: qty 10

## 2019-12-16 MED ORDER — ONDANSETRON 4 MG PO TBDP: 4.0000 mg | ORAL_TABLET | Freq: Four times a day (QID) | ORAL | Status: DC | PRN

## 2019-12-16 MED ORDER — INDOCYANINE GREEN 25 MG IV SOLR
2.5000 mg | Freq: Once | INTRAVENOUS | Status: AC
  Administered 2019-12-16: 2.5 mg via INTRAVENOUS

## 2019-12-16 MED ORDER — DIPHENHYDRAMINE HCL 50 MG/ML IJ SOLN: 12.5000 mg | Freq: Four times a day (QID) | INTRAMUSCULAR | Status: DC | PRN

## 2019-12-16 MED ORDER — MORPHINE SULFATE (PF) 2 MG/ML IV SOLN: 2.0000 mg | INTRAVENOUS | Status: DC | PRN

## 2019-12-16 MED ORDER — SUCCINYLCHOLINE CHLORIDE 20 MG/ML IJ SOLN
INTRAMUSCULAR | Status: DC | PRN
  Administered 2019-12-16: 120 mg via INTRAVENOUS

## 2019-12-16 MED ORDER — ROCURONIUM BROMIDE 100 MG/10ML IV SOLN
INTRAVENOUS | Status: DC | PRN
  Administered 2019-12-16: 50 mg via INTRAVENOUS

## 2019-12-16 MED ORDER — MORPHINE SULFATE (PF) 4 MG/ML IV SOLN
4.0000 mg | Freq: Once | INTRAVENOUS | Status: AC
  Administered 2019-12-16: 4 mg via INTRAVENOUS
  Filled 2019-12-16: qty 1

## 2019-12-16 MED ORDER — ACETAMINOPHEN 500 MG PO TABS
1000.0000 mg | ORAL_TABLET | Freq: Four times a day (QID) | ORAL | Status: DC
  Administered 2019-12-17 (×3): 1000 mg via ORAL
  Filled 2019-12-16 (×3): qty 2

## 2019-12-16 MED ORDER — ONDANSETRON HCL 4 MG/2ML IJ SOLN
4.0000 mg | Freq: Once | INTRAMUSCULAR | Status: AC
  Administered 2019-12-16: 4 mg via INTRAVENOUS
  Filled 2019-12-16: qty 2

## 2019-12-16 MED ORDER — ONDANSETRON HCL 4 MG/2ML IJ SOLN: 4.0000 mg | Freq: Once | INTRAMUSCULAR | Status: DC | PRN

## 2019-12-16 MED ORDER — PROPOFOL 10 MG/ML IV BOLUS
INTRAVENOUS | Status: DC | PRN
  Administered 2019-12-16: 150 mg via INTRAVENOUS

## 2019-12-16 MED ORDER — MIDAZOLAM HCL 2 MG/2ML IJ SOLN
INTRAMUSCULAR | Status: DC | PRN
  Administered 2019-12-16 (×2): 2 mg via INTRAVENOUS

## 2019-12-16 MED ORDER — BUPIVACAINE HCL (PF) 0.25 % IJ SOLN
INTRAMUSCULAR | Status: AC
  Filled 2019-12-16: qty 30

## 2019-12-16 MED ORDER — KETOROLAC TROMETHAMINE 30 MG/ML IJ SOLN
15.0000 mg | Freq: Once | INTRAMUSCULAR | Status: AC
  Administered 2019-12-16: 15 mg via INTRAVENOUS
  Filled 2019-12-16: qty 1

## 2019-12-16 MED ORDER — LACTATED RINGERS IV SOLN: INTRAVENOUS | Status: DC | PRN

## 2019-12-16 MED ORDER — ONDANSETRON HCL 4 MG/2ML IJ SOLN
4.0000 mg | Freq: Four times a day (QID) | INTRAMUSCULAR | Status: DC | PRN
  Administered 2019-12-17: 4 mg via INTRAVENOUS
  Filled 2019-12-16: qty 2

## 2019-12-16 MED ORDER — KETOROLAC TROMETHAMINE 30 MG/ML IJ SOLN
30.0000 mg | Freq: Four times a day (QID) | INTRAMUSCULAR | Status: DC
  Administered 2019-12-17 (×3): 30 mg via INTRAVENOUS
  Filled 2019-12-16 (×3): qty 1

## 2019-12-16 MED ORDER — SUGAMMADEX SODIUM 200 MG/2ML IV SOLN
INTRAVENOUS | Status: DC | PRN
  Administered 2019-12-16: 120 mg via INTRAVENOUS

## 2019-12-16 MED ORDER — FENTANYL CITRATE (PF) 100 MCG/2ML IJ SOLN
INTRAMUSCULAR | Status: DC | PRN
  Administered 2019-12-16 (×5): 50 ug via INTRAVENOUS

## 2019-12-16 SURGICAL SUPPLY — 47 items
CANISTER SUCT 1200ML W/VALVE (MISCELLANEOUS) ×2 IMPLANT
CANNULA REDUC XI 12-8 STAPL (CANNULA) ×1
CANNULA REDUCER 12-8 DVNC XI (CANNULA) ×1 IMPLANT
CHLORAPREP W/TINT 26 (MISCELLANEOUS) ×2 IMPLANT
CLIP VESOLOCK MED LG 6/CT (CLIP) ×2 IMPLANT
COVER WAND RF STERILE (DRAPES) ×2 IMPLANT
DECANTER SPIKE VIAL GLASS SM (MISCELLANEOUS) ×2 IMPLANT
DEFOGGER SCOPE WARMER CLEARIFY (MISCELLANEOUS) ×2 IMPLANT
DERMABOND ADVANCED (GAUZE/BANDAGES/DRESSINGS) ×1
DERMABOND ADVANCED .7 DNX12 (GAUZE/BANDAGES/DRESSINGS) ×1 IMPLANT
DRAPE ARM DVNC X/XI (DISPOSABLE) ×4 IMPLANT
DRAPE COLUMN DVNC XI (DISPOSABLE) ×1 IMPLANT
DRAPE DA VINCI XI ARM (DISPOSABLE) ×4
DRAPE DA VINCI XI COLUMN (DISPOSABLE) ×1
ELECT CAUTERY BLADE 6.4 (BLADE) ×2 IMPLANT
ELECT REM PT RETURN 9FT ADLT (ELECTROSURGICAL) ×2
ELECTRODE REM PT RTRN 9FT ADLT (ELECTROSURGICAL) ×1 IMPLANT
GLOVE BIO SURGEON STRL SZ7 (GLOVE) ×4 IMPLANT
GOWN STRL REUS W/ TWL LRG LVL3 (GOWN DISPOSABLE) ×4 IMPLANT
GOWN STRL REUS W/TWL LRG LVL3 (GOWN DISPOSABLE) ×4
IRRIGATION STRYKERFLOW (MISCELLANEOUS) IMPLANT
IRRIGATOR STRYKERFLOW (MISCELLANEOUS) ×2
IV NS 1000ML (IV SOLUTION) ×1
IV NS 1000ML BAXH (IV SOLUTION) IMPLANT
KIT PINK PAD W/HEAD ARE REST (MISCELLANEOUS) ×2
KIT PINK PAD W/HEAD ARM REST (MISCELLANEOUS) ×1 IMPLANT
LABEL OR SOLS (LABEL) ×2 IMPLANT
NEEDLE HYPO 22GX1.5 SAFETY (NEEDLE) ×2 IMPLANT
NS IRRIG 500ML POUR BTL (IV SOLUTION) ×2 IMPLANT
OBTURATOR OPTICAL STANDARD 8MM (TROCAR) ×1
OBTURATOR OPTICAL STND 8 DVNC (TROCAR) ×1
OBTURATOR OPTICALSTD 8 DVNC (TROCAR) ×1 IMPLANT
PACK LAP CHOLECYSTECTOMY (MISCELLANEOUS) ×2 IMPLANT
PENCIL ELECTRO HAND CTR (MISCELLANEOUS) ×2 IMPLANT
POUCH SPECIMEN RETRIEVAL 10MM (ENDOMECHANICALS) ×2 IMPLANT
SEAL CANN UNIV 5-8 DVNC XI (MISCELLANEOUS) ×3 IMPLANT
SEAL XI 5MM-8MM UNIVERSAL (MISCELLANEOUS) ×3
SET TUBE SMOKE EVAC HIGH FLOW (TUBING) ×2 IMPLANT
SOLUTION ELECTROLUBE (MISCELLANEOUS) ×2 IMPLANT
SPONGE LAP 18X18 RF (DISPOSABLE) ×2 IMPLANT
SPONGE LAP 4X18 RFD (DISPOSABLE) IMPLANT
STAPLER CANNULA SEAL DVNC XI (STAPLE) ×1 IMPLANT
STAPLER CANNULA SEAL XI (STAPLE) ×1
SUT MNCRL AB 4-0 PS2 18 (SUTURE) ×2 IMPLANT
SUT VICRYL 0 AB UR-6 (SUTURE) ×4 IMPLANT
TAPE TRANSPORE STRL 2 31045 (GAUZE/BANDAGES/DRESSINGS) ×2 IMPLANT
TROCAR 130MM GELPORT  DAV (MISCELLANEOUS) ×2 IMPLANT

## 2019-12-16 NOTE — Op Note (Signed)
Robotic assisted laparoscopic Cholecystectomy  Pre-operative Diagnosis: Cholecystitis  Post-operative Diagnosis: same  Procedure:  Robotic assisted laparoscopic Cholecystectomy  Surgeon: Sterling Big, MD FACS  Anesthesia: Gen. with endotracheal tube  Findings: Acute Cholecystitis   Estimated Blood Loss: 10 cc       Specimens: Gallbladder           Complications: none   Procedure Details  The patient was seen again in the Holding Room. The benefits, complications, treatment options, and expected outcomes were discussed with the patient. The risks of bleeding, infection, recurrence of symptoms, failure to resolve symptoms, bile duct damage, bile duct leak, retained common bile duct stone, bowel injury, any of which could require further surgery and/or ERCP, stent, or papillotomy were reviewed with the patient. The likelihood of improving the patient's symptoms with return to their baseline status is good.  The patient and/or family concurred with the proposed plan, giving informed consent.  The patient was taken to Operating Room, identified  and the procedure verified as Laparoscopic Cholecystectomy.  A Time Out was held and the above information confirmed.  Prior to the induction of general anesthesia, antibiotic prophylaxis was administered. VTE prophylaxis was in place. General endotracheal anesthesia was then administered and tolerated well. After the induction, the abdomen was prepped with Chloraprep and draped in the sterile fashion. The patient was positioned in the supine position.  Cut down technique was used to enter the abdominal cavity and a Hasson trochar was placed after two vicryl stitches were anchored to the fascia. Pneumoperitoneum was then created with CO2 and tolerated well without any adverse changes in the patient's vital signs.  Three 8-mm ports were placed under direct vision. All skin incisions  were infiltrated with a local anesthetic agent before making the  incision and placing the trocars.   The patient was positioned  in reverse Trendelenburg, robot was brought to the surgical field and docked in the standard fashion.  We made sure all the instrumentation was kept indirect view at all times and that there were no collision between the arms. I scrubbed out and went to the console.  The gallbladder was identified, the fundus grasped and retracted cephalad. Adhesions were lysed bluntly. The infundibulum was grasped and retracted laterally, exposing the peritoneum overlying the triangle of Calot. This was then divided and exposed in a blunt fashion. An extended critical view of the cystic duct and cystic artery was obtained.  The cystic duct was clearly identified and bluntly dissected.   Artery and duct were double clipped and divided. Using ICG cholangiography we visualize the cystic duct and so no a Baron biliary ductal anatomy or evidence of bile injuries. The gallbladder was taken from the gallbladder fossa in a retrograde fashion with the electrocautery.  Hemostasis was achieved with the electrocautery. nspection of the right upper quadrant was performed. No bleeding, bile duct injury or leak, or bowel injury was noted. Robotic instruments and robotic arms were undocked in the standard fashion.  I scrubbed back in.  The gallbladder was removed and placed in an Endocatch bag.   Pneumoperitoneum was released.  The periumbilical port site was closed with interrumpted 0 Vicryl sutures. 4-0 subcuticular Monocryl was used to close the skin. Dermabond was  applied.  The patient was then extubated and brought to the recovery room in stable condition. Sponge, lap, and needle counts were correct at closure and at the conclusion of the case.  Caroleen Hamman, MD, FACS

## 2019-12-16 NOTE — ED Notes (Signed)
Pt appears much more comfortable.  Reports pain has improved.

## 2019-12-16 NOTE — ED Triage Notes (Signed)
Patient ambulatory to triage with steady gait, without difficulty or distress noted; pt reports upper abd pain radiating into back since 11pm; postpartum, (5/10)

## 2019-12-16 NOTE — ED Notes (Signed)
Pt appears in pain.  [place] results reviewed. Pain protocol ordered.

## 2019-12-16 NOTE — Anesthesia Procedure Notes (Signed)
Procedure Name: Intubation Date/Time: 12/16/2019 5:37 PM Performed by: Elmarie Mainland, CRNA Pre-anesthesia Checklist: Patient identified, Emergency Drugs available, Suction available and Patient being monitored Patient Re-evaluated:Patient Re-evaluated prior to induction Oxygen Delivery Method: Circle system utilized Preoxygenation: Pre-oxygenation with 100% oxygen Induction Type: IV induction and Rapid sequence Ventilation: Mask ventilation without difficulty Laryngoscope Size: McGraph and 3 Grade View: Grade I Tube type: Oral Tube size: 7.0 mm Number of attempts: 1 Airway Equipment and Method: Stylet and Oral airway Placement Confirmation: ETT inserted through vocal cords under direct vision,  positive ETCO2 and breath sounds checked- equal and bilateral Secured at: 22 cm Tube secured with: Tape Dental Injury: Teeth and Oropharynx as per pre-operative assessment

## 2019-12-16 NOTE — Anesthesia Preprocedure Evaluation (Signed)
Anesthesia Evaluation  Patient identified by MRN, date of birth, ID band Patient awake    Reviewed: Allergy & Precautions, H&P , NPO status , Patient's Chart, lab work & pertinent test results  Airway Mallampati: II  TM Distance: >3 FB Neck ROM: full    Dental  (+) Teeth Intact   Pulmonary neg pulmonary ROS,           Cardiovascular Exercise Tolerance: Good (-) hypertensionnegative cardio ROS       Neuro/Psych negative neurological ROS  negative psych ROS   GI/Hepatic negative GI ROS, Neg liver ROS,   Endo/Other  negative endocrine ROS  Renal/GU   negative genitourinary   Musculoskeletal negative musculoskeletal ROS (+)   Abdominal   Peds negative pediatric ROS (+)  Hematology negative hematology ROS (+)   Anesthesia Other Findings Past Medical History: 2012: Abnormal Pap smear of cervix No date: Alpha thalassemia silent carrier No date: Constipation  Past Surgical History: No date: DG SELECTED HSG GDC ONLY No date: HYSTEROSALPINGOGRAM  BMI    Body Mass Index: 25.23 kg/m      Reproductive/Obstetrics negative OB ROS                             Anesthesia Physical  Anesthesia Plan  ASA: II  Anesthesia Plan: General   Post-op Pain Management:    Induction: Intravenous  PONV Risk Score and Plan:   Airway Management Planned: Oral ETT  Additional Equipment:   Intra-op Plan:   Post-operative Plan: Extubation in OR  Informed Consent: I have reviewed the patients History and Physical, chart, labs and discussed the procedure including the risks, benefits and alternatives for the proposed anesthesia with the patient or authorized representative who has indicated his/her understanding and acceptance.       Plan Discussed with: Anesthesiologist and CRNA  Anesthesia Plan Comments:         Anesthesia Quick Evaluation

## 2019-12-16 NOTE — Transfer of Care (Signed)
Immediate Anesthesia Transfer of Care Note  Patient: Maureen Black  Procedure(s) Performed: XI ROBOTIC ASSISTED LAPAROSCOPIC CHOLECYSTECTOMY (N/A )  Patient Location: PACU  Anesthesia Type:General  Level of Consciousness: drowsy and patient cooperative  Airway & Oxygen Therapy: Patient Spontanous Breathing and Patient connected to nasal cannula oxygen  Post-op Assessment: Report given to RN and Post -op Vital signs reviewed and stable  Post vital signs: Reviewed and stable  Last Vitals:  Vitals Value Taken Time  BP    Temp    Pulse    Resp    SpO2      Last Pain:  Vitals:   12/16/19 0624  TempSrc: Oral  PainSc: 8          Complications: No apparent anesthesia complications

## 2019-12-16 NOTE — ED Provider Notes (Signed)
[facility] Emergency Department Provider Note  ____________________________________________  Time seen: Approximately 10:58 AM  I have reviewed the triage vital signs and the nursing notes.   HISTORY  Chief Complaint Abdominal Pain    HPI Maureen Black is a 29 y.o. female with no significant past medical history who comes ED complaining of epigastric pain radiating to the back that started at 11 PM last night, constant, waxing and waning, no aggravating or alleviating factors.  Reports having intermittent episodes of similar pain in the past which usually last for about 30 minutes and then resolved but tonight's episode has been much worse.  No postprandial symptoms, ate a normal dinner at about 7 PM last night.  Does report one episode of vomiting and 1 loose bowel movement this morning.      Past Medical History:  Diagnosis Date  . Abnormal Pap smear of cervix 2012  . Alpha thalassemia silent carrier   . Constipation      Patient Active Problem List   Diagnosis Date Noted  . Encounter for induction of labor 11/24/2019  . Normal vaginal delivery 11/24/2019  . Postpartum care following vaginal delivery 11/24/2019  . Intrauterine growth restriction (IUGR) affecting care of mother, third trimester, single gestation 11/23/2019  . Intrauterine growth restriction affecting antepartum care of mother 11/19/2019  . Abdominal pain affecting pregnancy 11/09/2019  . Supervision of high risk pregnancy, antepartum 05/01/2019  . Pregnancy conceived through in vitro fertilization 05/01/2019     Past Surgical History:  Procedure Laterality Date  . DG SELECTED HSG GDC ONLY    . HYSTEROSALPINGOGRAM       Prior to Admission medications   Medication Sig Start Date End Date Taking? Authorizing Provider  BONJESTA 20-20 MG TBCR Take 1 tablet by mouth 2 (two) times daily. 08/12/19   Nadara Mustard, MD  ferrous sulfate 325 (65 FE) MG tablet Take 1  tablet (325 mg total) by mouth daily with breakfast. 11/26/19   Mirna Mires, CNM  Prenatal Vit-Fe Fumarate-FA (PRENATAL VITAMINS) 28-0.8 MG TABS Take 1 tablet by mouth daily.    [provider]     Allergies Patient has no known allergies.   Family History  Problem Relation Age of Onset  . Hypertension Mother   . Diabetes Maternal Grandmother   . Lung cancer Paternal Grandmother     Social History Social History   Tobacco Use  . Smoking status: Never Smoker  . Smokeless tobacco: Never Used  Substance Use Topics  . Alcohol use: Not Currently    Comment: socially  . Drug use: No    Review of Systems  Constitutional:   No fever or chills.  ENT:   No sore throat. No rhinorrhea. Cardiovascular:   No chest pain or syncope. Respiratory:   No dyspnea or cough. Gastrointestinal:   Positive as above for epigastric pain and vomiting. Musculoskeletal:   Negative for focal pain or swelling All other systems reviewed and are negative except as documented above in ROS and HPI.  ____________________________________________   PHYSICAL EXAM:  VITAL SIGNS: ED Triage Vitals [12/16/19 0624]  Enc Vitals Group     BP (!) 135/52     Pulse Rate (!) 112     Resp 20     Temp 99.2 F (37.3 C)     Temp Source Oral     SpO2 100 %     Weight 130 lb (59 kg)     Height 5\' 4"  (1.626 m)  Head Circumference      Peak Flow      Pain Score 8     Pain Loc      Pain Edu?      Excl. in [place]?     Vital signs reviewed, nursing assessments reviewed.   Constitutional:   Alert and oriented. Non-toxic appearance. Eyes:   Conjunctivae are normal. EOMI. PERRL. ENT      Head:   Normocephalic and atraumatic.      Nose:   Wearing a mask.      Mouth/Throat:   Wearing a mask.      Neck:   No meningismus. Full ROM. Hematological/Lymphatic/Immunilogical:   No cervical lymphadenopathy. Cardiovascular:   RRR. Symmetric bilateral radial and DP pulses.  No murmurs. Cap refill less than 2  seconds. Respiratory:   Normal respiratory effort without tachypnea/retractions. Breath sounds are clear and equal bilaterally. No wheezes/rales/rhonchi. Gastrointestinal:   Soft with exquisite right upper quadrant tenderness. Non distended. There is no CVA tenderness.  No rebound, rigidity, or guarding.  Musculoskeletal:   Normal range of motion in all extremities. No joint effusions.  No lower extremity tenderness.  No edema. Neurologic:   Normal speech and language.  Motor grossly intact. No acute focal neurologic deficits are appreciated.  Skin:    Skin is warm, dry and intact. No rash noted.  No petechiae, purpura, or bullae.  ____________________________________________    LABS (pertinent positives/negatives) (all labs ordered are listed, but only abnormal results are displayed) Labs Reviewed  CBC WITH DIFFERENTIAL/PLATELET - Abnormal; Notable for the following components:      Result Value   Platelets 418 (*)    All other components within normal limits  COMPREHENSIVE METABOLIC PANEL - Abnormal; Notable for the following components:   Glucose, Bld 111 (*)    All other components within normal limits  URINALYSIS, COMPLETE (UACMP) WITH MICROSCOPIC - Abnormal; Notable for the following components:   Color, Urine YELLOW (*)    APPearance CLOUDY (*)    Hgb urine dipstick MODERATE (*)    Leukocytes,Ua MODERATE (*)    Bacteria, UA RARE (*)    All other components within normal limits  SARS CORONAVIRUS 2 BY RT PCR (HOSPITAL ORDER, [facility])  LIPASE, BLOOD  POCT PREGNANCY, URINE  TROPONIN I (HIGH SENSITIVITY)   ____________________________________________   EKG  Interpreted by me Normal sinus rhythm rate of 93, normal axis and intervals.  Normal QRS ST segments and T waves.  No ischemic changes.  ____________________________________________    RADIOLOGY  MR 3D Recon At Scanner  Result Date: 12/16/2019 CLINICAL DATA:  Right upper quadrant  and epigastric pain intermittently. Associated nausea vomiting. No fever or chills. EXAM: MRI ABDOMEN WITHOUT AND WITH CONTRAST (INCLUDING MRCP) TECHNIQUE: Multiplanar multisequence MR imaging of the abdomen was performed both before and after the administration of intravenous contrast. Heavily T2-weighted images of the biliary and pancreatic ducts were obtained, and three-dimensional MRCP images were rendered by post processing. CONTRAST:  42mL GADAVIST GADOBUTROL 1 MMOL/ML IV SOLN COMPARISON:  Ultrasound 12/16/2019 FINDINGS: Lower chest: The lung bases are clear. No pulmonary lesions or infiltrates. No pleural or pericardial effusion. Hepatobiliary: No hepatic lesions or intrahepatic biliary dilatation. The gallbladder demonstrates small layering gallstones but no MR findings for acute cholecystitis. Normal caliber and course of the common bile duct. No common bile duct dilatation or stones. Pancreas:  Normal Spleen:  Normal Adrenals/Urinary Tract: The adrenal glands and kidneys are unremarkable. No renal lesions or  hydronephrosis. Stomach/Bowel: The stomach, duodenum, visualized small bowel and visualized colon are unremarkable. Vascular/Lymphatic: The aorta and branch vessels are normal. 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 No mesenteric or retroperitoneal mass or adenopathy. Other: Small periumbilical abdominal wall hernia containing fat and a small bowel loop. No evidence of incarceration or obstruction. Musculoskeletal: No significant bony findings. IMPRESSION: 1. Cholelithiasis but no MR findings for acute cholecystitis. Normal caliber and course of the common bile duct. No common bile duct stones. 2. No acute abdominal findings, mass lesions or adenopathy. 3. Small periumbilical abdominal wall hernia containing fat and a small bowel loop. No evidence of incarceration or obstruction. Electronically Signed   By: Rudie Meyer M.D.   On: 12/16/2019 14:21   US Abdomen Limited  Result Date: 12/16/2019 CLINICAL  DATA:  Right upper quadrant pain. EXAM: ULTRASOUND ABDOMEN LIMITED RIGHT UPPER QUADRANT COMPARISON:  No recent prior. FINDINGS: Gallbladder: Multiple small gallstones measuring up to 5 mm noted. Gallbladder wall thickness 2.2 mm. Negative Murphy sign. Common bile duct: Diameter: Common bile duct is prominent at 7.9 mm. Mild intrahepatic biliary ductal distention noted. Liver: No focal lesion identified. Within normal limits in parenchymal echogenicity. Portal vein is patent on color Doppler imaging with normal direction of blood flow towards the liver. Other: None. IMPRESSION: 1. Multiple tiny gallstones measuring up to 5 mm. No gallbladder wall thickening. Negative Murphy sign. 2. Common bile duct is prominent at 7.9 mm. Mild intrahepatic biliary ductal distention noted. Findings suggest possibility common bile duct obstruction, possibly secondary to a stone. Electronically Signed   By: Maisie Fus  Register   On: 12/16/2019 07:01   MR ABDOMEN MRCP W WO CONTAST  Result Date: 12/16/2019 CLINICAL DATA:  Right upper quadrant and epigastric pain intermittently. Associated nausea vomiting. No fever or chills. EXAM: MRI ABDOMEN WITHOUT AND WITH CONTRAST (INCLUDING MRCP) TECHNIQUE: Multiplanar multisequence MR imaging of the abdomen was performed both before and after the administration of intravenous contrast. Heavily T2-weighted images of the biliary and pancreatic ducts were obtained, and three-dimensional MRCP images were rendered by post processing. CONTRAST:  41mL GADAVIST GADOBUTROL 1 MMOL/ML IV SOLN COMPARISON:  Ultrasound 12/16/2019 FINDINGS: Lower chest: The lung bases are clear. No pulmonary lesions or infiltrates. No pleural or pericardial effusion. Hepatobiliary: No hepatic lesions or intrahepatic biliary dilatation. The gallbladder demonstrates small layering gallstones but no MR findings for acute cholecystitis. Normal caliber and course of the common bile duct. No common bile duct dilatation or stones.  Pancreas:  Normal Spleen:  Normal Adrenals/Urinary Tract: The adrenal glands and kidneys are unremarkable. No renal lesions or hydronephrosis. Stomach/Bowel: The stomach, duodenum, visualized small bowel and visualized colon are unremarkable. Vascular/Lymphatic: The aorta and branch vessels are normal. 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 No mesenteric or retroperitoneal mass or adenopathy. Other: Small periumbilical abdominal wall hernia containing fat and a small bowel loop. No evidence of incarceration or obstruction. Musculoskeletal: No significant bony findings. IMPRESSION: 1. Cholelithiasis but no MR findings for acute cholecystitis. Normal caliber and course of the common bile duct. No common bile duct stones. 2. No acute abdominal findings, mass lesions or adenopathy. 3. Small periumbilical abdominal wall hernia containing fat and a small bowel loop. No evidence of incarceration or obstruction. Electronically Signed   By: Rudie Meyer M.D.   On: 12/16/2019 14:21    ____________________________________________   PROCEDURES Procedures  ____________________________________________  DIFFERENTIAL DIAGNOSIS   Cholecystitis, choledocholithiasis, pancreatitis, gastritis, biliary colic  CLINICAL IMPRESSION / ASSESSMENT AND PLAN / ED COURSE  Medications ordered  in the ED: Medications  fentaNYL (SUBLIMAZE) injection 50 mcg (50 mcg Intravenous Given 12/16/19 0800)  cefTRIAXone (ROCEPHIN) 2 g in sodium chloride 0.9 % 100 mL IVPB (2 g Intravenous New Bag/Given 12/16/19 1309)  ondansetron (ZOFRAN) injection 4 mg (4 mg Intravenous Given 12/16/19 0800)  ketorolac (TORADOL) 30 MG/ML injection 15 mg (15 mg Intravenous Given 12/16/19 0804)  morphine 4 MG/ML injection 4 mg (4 mg Intravenous Given 12/16/19 1120)  sodium chloride 0.9 % bolus 1,000 mL (1,000 mLs Intravenous New Bag/Given 12/16/19 1308)  gadobutrol (GADAVIST) 1 MMOL/ML injection 5 mL (5 mLs Intravenous Contrast Given 12/16/19 1409)    Pertinent labs &  imaging results that were available during my care of the patient were reviewed by me and considered in my medical decision making (see chart for details).  Aniqua Birttany Dechellis was evaluated in Emergency Department on 12/16/2019 for the symptoms described in the history of present illness. She was evaluated in the context of the global COVID-19 pandemic, which necessitated consideration that the patient might be at risk for infection with the SARS-CoV-2 virus that causes COVID-19. Institutional protocols and algorithms that pertain to the evaluation of patients at risk for COVID-19 are in a state of rapid change based on information released by regulatory bodies including the CDC and federal and state organizations. These policies and algorithms were followed during the patient's care in the ED.     Clinical Course as of Dec 15 1432  Tue Dec 16, 2019  0929 Pt p/w epigastric pain radiating to back since 11pm last night. Exam concerning for biliary disease and US shows multiple gallstones and mildly dilated CBD without hard signs of cholecystitis. Labs unremarkable, LFTs and lipase normal. Will consult surgery given ongoing pain/tenderness.   [PS]  1125 Dr. Everlene Farrier recommends obtain MRCP - will order.   [PS]  1303 D/w surgery, plans to proceed with lap chole after MRI    [PS]  1427 MRI negative for acute cholecystitis or choledocholithiasis. Further management per Dr. Everlene Farrier.    [PS]    Clinical Course User Index [PS] Sharman Cheek, MD     ____________________________________________   FINAL CLINICAL IMPRESSION(S) / ED DIAGNOSES    Final diagnoses:  Epigastric pain  Cholecystitis     ED Discharge Orders    None      Portions of this note were generated with dragon dictation software. Dictation errors may occur despite best attempts at proofreading.   Sharman Cheek, MD 12/16/19 1434

## 2019-12-16 NOTE — H&P (Signed)
Patient ID: Maureen Black, female   DOB: October 08, 1990, 29 y.o.   MRN: [id number]  HPI Maureen Black is a 29 y.o. female  With about 12 hours of epigastric and right upper quadrant pain.  Patient reports that the pain is moderate to severe intensity intermittent and mainly located in the epigastric area.  Also had associated nausea and vomiting.  No fevers no chills no evidence of biliary obstruction.  No evidence of cholangitis.  She reports that the pain radiates to her back. Did have an ultrasound that have personally reviewed showing evidence of a dilated common bile duct measuring 8 mm.  There are stones in the gallbladder without definitive evidence of cholecystitis.  CBC and CMP is completely normal.  She is able to perform more than 4 METS of activity without any shortness of breath or chest pain.  He had a normal vaginal delivery 3 weeks ago.   HPI  Past Medical History:  Diagnosis Date  . Abnormal Pap smear of cervix 2012  . Alpha thalassemia silent carrier   . Constipation     Past Surgical History:  Procedure Laterality Date  . DG SELECTED HSG GDC ONLY    . HYSTEROSALPINGOGRAM      Family History  Problem Relation Age of Onset  . Hypertension Mother   . Diabetes Maternal Grandmother   . Lung cancer Paternal Grandmother     Social History Social History   Tobacco Use  . Smoking status: Never Smoker  . Smokeless tobacco: Never Used  Substance Use Topics  . Alcohol use: Not Currently    Comment: socially  . Drug use: No    No Known Allergies  Current Facility-Administered Medications  Medication Dose Route Frequency Provider Last Rate Last Admin  . fentaNYL (SUBLIMAZE) injection 50 mcg  50 mcg Intravenous Q30 min PRN Carrie Mew, MD   50 mcg at 12/16/19 0800   Current Outpatient Medications  Medication Sig Dispense Refill  . BONJESTA 20-20 MG TBCR Take 1 tablet by mouth 2 (two) times daily. 60 tablet 6  . ferrous sulfate 325 (65 FE)  MG tablet Take 1 tablet (325 mg total) by mouth daily with breakfast. 30 tablet 1  . Prenatal Vit-Fe Fumarate-FA (PRENATAL VITAMINS) 28-0.8 MG TABS Take 1 tablet by mouth daily.       Review of Systems Full ROS  was asked and was negative except for the information on the HPI  Physical Exam Blood pressure 118/76, pulse 76, temperature 99.2 F (37.3 C), temperature source Oral, resp. rate (!) 24, height 5\' 4"  (1.626 m), weight 59 kg, SpO2 100 %, unknown if currently breastfeeding. CONSTITUTIONAL: NAD EYES: Pupils are equal, round, and reactive to light, Sclera are non-icteric. EARS, NOSE, MOUTH AND THROAT: The oropharynx is clear. The oral mucosa is pink and moist. Hearing is intact to voice. LYMPH NODES:  Lymph nodes in the neck are normal. RESPIRATORY:  Lungs are clear. There is normal respiratory effort, with equal breath sounds bilaterally, and without pathologic use of accessory muscles. CARDIOVASCULAR: Heart is regular without murmurs, gallops, or rubs. GI: The abdomen is  Soft,TTP RUQ w + Murphy sign. No peritonitis. I Cannot feel uterus GU: Rectal deferred.   MUSCULOSKELETAL: Normal muscle strength and tone. No cyanosis or edema.   SKIN: Turgor is good and there are no pathologic skin lesions or ulcers. NEUROLOGIC: Motor and sensation is grossly normal. Cranial nerves are grossly intact. PSYCH:  Oriented to person, place and time. Affect is normal.  Data Reviewed  I have personally reviewed the patient's imaging, laboratory findings and medical records.    Assessment/Plan 29 year old female with signs and symptoms consistent with acute cholecystitis.  There is evidence of mild dilation of the common bile duct and I do think that the common bile duct needs to be interrogated.  I do think that she will need cholecystectomy during this hospitalization.  She is anxious to get home to her baby and wishes to expedite things.  Is with her about potential for preop versus postoperative  ERCP in the interest of efficiency she wants to move forward with cholecystectomy today. I discussed the procedure in detail.  The patient was given Agricultural engineer.  We discussed the risks and benefits of a laparoscopic cholecystectomy and possible cholangiogram including, but not limited to bleeding, infection, injury to surrounding structures such as the intestine or liver, bile leak, retained gallstones, need to convert to an open procedure, prolonged diarrhea, blood clots such as  DVT, common bile duct injury, anesthesia risks, and possible need for additional procedures.  The likelihood of improvement in symptoms and return to the patient's normal status is good. We discussed the typical post-operative recovery course. We will start crystalloid resuscitation and antibiotics.  We will proceed with cholecystectomy pending or availability.  Sterling Big, MD FACS General Surgeon 12/16/2019, 12:43 PM

## 2019-12-17 LAB — HIV ANTIBODY (ROUTINE TESTING W REFLEX): HIV Screen 4th Generation wRfx: NONREACTIVE

## 2019-12-17 MED ORDER — OXYCODONE HCL 5 MG PO TABS: 5.0000 mg | ORAL_TABLET | Freq: Four times a day (QID) | ORAL | 0 refills | Status: DC | PRN

## 2019-12-17 MED ORDER — IBUPROFEN 800 MG PO TABS
800.0000 mg | ORAL_TABLET | Freq: Three times a day (TID) | ORAL | 0 refills | Status: DC | PRN
Start: 2019-12-17 — End: 2020-09-01

## 2019-12-17 NOTE — Discharge Instructions (Signed)
In addition to included general post-operative instructions for laparoscopic cholecystectomy,  Diet: Resume home diet. You may notice loose stools with fatty/greasy foods for the first few days, this is common, will resolve with time   Activity: No heavy lifting >20 pounds (children, pets, laundry, garbage) for 4 weeks, but light activity and walking are encouraged. Do not drive or drink alcohol if taking narcotic pain medications or having pain that might distract from driving.  Wound care: You may shower/get incision wet with soapy water and pat dry (do not rub incisions), but no baths or submerging incision underwater until follow-up.   Medications: Resume all home medications. For mild to moderate pain: acetaminophen (Tylenol) or ibuprofen/naproxen (if no kidney disease). Combining Tylenol with alcohol can substantially increase your risk of causing liver disease. Narcotic pain medications, if prescribed, can be used for severe pain, though may cause nausea, constipation, and drowsiness. Do not combine Tylenol and Percocet (or similar) within a 6 hour period as Percocet (and similar) contain(s) Tylenol. If you do not need the narcotic pain medication, you do not need to fill the prescription.  Call office [phone] / [phone]) at any time if any questions, worsening pain, fevers/chills, bleeding, drainage from incision site, or other concerns.

## 2019-12-17 NOTE — Discharge Summary (Signed)
[facility] SURGICAL DISCHARGE SUMMARY  Patient ID: Maureen Black MRN: [id number] DOB/AGE: 16-May-1991 29 y.o.  Admit date: 12/16/2019 Discharge date: 12/17/2019  Discharge Diagnoses Patient Active Problem List   Diagnosis Date Noted  . Cholecystitis 12/16/2019  . Acute cholecystitis 12/16/2019    Consultants None  Procedures 12/16/2019:  Robotic assisted laparoscopic cholecystectomy   HPI: Maureen Black is a 29 y.o. female  With about 12 hours of epigastric and right upper quadrant pain.  Patient reports that the pain is moderate to severe intensity intermittent and mainly located in the epigastric area.  Also had associated nausea and vomiting.  No fevers no chills no evidence of biliary obstruction.  No evidence of cholangitis.  She reports that the pain radiates to her back. Did have an ultrasound that have personally reviewed showing evidence of a dilated common bile duct measuring 8 mm.  There are stones in the gallbladder without definitive evidence of cholecystitis.  CBC and CMP is completely normal.  She is able to perform more than 4 METS of activity without any shortness of breath or chest pain.  He had a normal vaginal delivery 3 weeks ago.  Hospital Course: Informed consent was obtained and documented, and patient underwent uneventful robotic assisted laparoscopic cholecystectomy (Dr Everlene Farrier, 12/16/2019).  Post-operatively, patient's pain/symptoms improved/resolved and advancement of patient's diet and ambulation were well-tolerated. The remainder of patient's hospital course was essentially unremarkable, and discharge planning was initiated accordingly with patient safely able to be discharged home with appropriate discharge instructions, pain control, and outpatient follow-up after all of her and her family's questions were answered to their expressed satisfaction.   Discharge Condition: Good   Physical Examination:  Constitutional:  Well appearing female, NAD Pulmonary: Normal effort, no respiratory distress Gastrointestinal: Soft, incisional soreness, non-distended, no rebound/guarding Skin: Laparoscopic incisions are CDI with dermabond, no erythema, no drainage    Allergies as of 12/17/2019   No Known Allergies     Medication List    TAKE these medications   ferrous sulfate 325 (65 FE) MG tablet Take 1 tablet (325 mg total) by mouth daily with breakfast.   ibuprofen 800 MG tablet Commonly known as: ADVIL Take 1 tablet (800 mg total) by mouth every 8 (eight) hours as needed.   oxyCODONE 5 MG immediate release tablet Commonly known as: Oxy IR/ROXICODONE Take 1 tablet (5 mg total) by mouth every 6 (six) hours as needed for severe pain.   Prenatal Vitamins 28-0.8 MG Tabs Take 1 tablet by mouth daily.   THERANATAL LACTATION COMPLETE PO Take by mouth.        Follow-up Information    Donovan Kail, PA-C. Schedule an appointment as soon as possible for a visit in 2 week(s).   Specialty: Physician Assistant Why: s/p robotic cholecystectomy Contact information: [address] [place] [address] [phone]            Time spent on discharge management including discussion of hospital course, clinical condition, outpatient instructions, prescriptions, and follow up with the patient and members of the medical team: >30 minutes  -- Lynden Oxford , PA-C [facility]  12/17/2019, 11:00 AM [phone] M-F: 7am - 4pm

## 2019-12-17 NOTE — Progress Notes (Signed)
Maureen Black to be D/C'd Home per MD order.  Discussed prescriptions and follow up appointments with the patient. Prescriptions were eprescribed, medication list explained in detail. Pt verbalized understanding.  Allergies as of 12/17/2019   No Known Allergies     Medication List    TAKE these medications   ferrous sulfate 325 (65 FE) MG tablet Take 1 tablet (325 mg total) by mouth daily with breakfast.   ibuprofen 800 MG tablet Commonly known as: ADVIL Take 1 tablet (800 mg total) by mouth every 8 (eight) hours as needed.   oxyCODONE 5 MG immediate release tablet Commonly known as: Oxy IR/ROXICODONE Take 1 tablet (5 mg total) by mouth every 6 (six) hours as needed for severe pain.   Prenatal Vitamins 28-0.8 MG Tabs Take 1 tablet by mouth daily.   THERANATAL LACTATION COMPLETE PO Take by mouth.       Vitals:   12/17/19 0523 12/17/19 0827  BP: 108/66 125/75  Pulse: 66 62  Resp: 20 18  Temp: 99.5 F (37.5 C) 97.8 F (36.6 C)  SpO2: 99% 100%    Skin clean, dry and intact without evidence of skin break down, no evidence of skin tears noted. IV catheter discontinued intact. Site without signs and symptoms of complications. Dressing and pressure applied. Pt denies pain at this time. No complaints noted.  An After Visit Summary was printed and given to the patient. Patient escorted via WC, and D/C home via private auto.  [name]

## 2019-12-17 NOTE — Anesthesia Postprocedure Evaluation (Signed)
Anesthesia Post Note  Patient: Camera operator  Procedure(s) Performed: XI ROBOTIC ASSISTED LAPAROSCOPIC CHOLECYSTECTOMY (N/A )  Patient location during evaluation: PACU Anesthesia Type: General Level of consciousness: awake and alert and oriented Pain management: pain level controlled Vital Signs Assessment: post-procedure vital signs reviewed and stable Respiratory status: spontaneous breathing Cardiovascular status: blood pressure returned to baseline Anesthetic complications: no     Last Vitals:  Vitals:   12/17/19 0109 12/17/19 0523  BP: 112/70 108/66  Pulse: 61 66  Resp: 18 20  Temp: 36.8 C 37.5 C  SpO2: 100% 99%    Last Pain:  Vitals:   12/17/19 0523  TempSrc: Oral  PainSc:                  Tudor Chandley

## 2019-12-18 LAB — SURGICAL PATHOLOGY

## 2019-12-31 ENCOUNTER — Encounter: Payer: Self-pay | Admitting: Physician Assistant

## 2019-12-31 ENCOUNTER — Other Ambulatory Visit: Payer: Self-pay

## 2019-12-31 ENCOUNTER — Ambulatory Visit (INDEPENDENT_AMBULATORY_CARE_PROVIDER_SITE_OTHER): Payer: Self-pay | Admitting: Physician Assistant

## 2019-12-31 VITALS — BP 103/68 | HR 74 | Temp 97.5°F | Ht 64.0 in | Wt 129.0 lb

## 2019-12-31 DIAGNOSIS — Z09 Encounter for follow-up examination after completed treatment for conditions other than malignant neoplasm: Secondary | ICD-10-CM

## 2019-12-31 DIAGNOSIS — K810 Acute cholecystitis: Secondary | ICD-10-CM

## 2019-12-31 NOTE — Progress Notes (Signed)
[facility] POST-OP OFFICE VISIT  12/31/2019  HPI: Tona Qualley is a 29 y.o. female 15 days s/p robotic assisted laparoscopic cholecystectomy for cholecystitis with Dr Everlene Farrier  Overall doing well No issues with pain No fever, chills, nausea, emesis, or diarrhea Tolerating PO No other issues   Vital signs: BP 103/68   Pulse 74   Temp (!) 97.5 F (36.4 C) (Temporal)   Ht 5\' 4"  (1.626 m)   Wt 129 lb (58.5 kg)   SpO2 98%   BMI 22.14 kg/m    Physical Exam: Constitutional: Well appearing female, NAD Abdomen: Soft, non-tender, non-distended, no rebound/guarding Skin: Laparoscopic incisions are CDI and well healed, no erythema or drainage   Assessment/Plan: This is a 29 y.o. female robotic assisted laparoscopic cholecystectomy for cholecystitis   - Pain control prn  - reviewed lifting restrictions; 2 more weeks  - reviewed pathology  - rtc prn  -- 38, PA-C [facility] 12/31/2019, 9:59 AM [phone] M-F: 7am - 4pm

## 2019-12-31 NOTE — Patient Instructions (Addendum)
Patient is to refrain from any heavy lifting, bending, pushing or pulling of 20 pounds or more for a total of six weeks.  Follow-up with our office as needed.  Please call and ask to speak with a nurse if you develop questions or concerns.   GENERAL POST-OPERATIVE PATIENT INSTRUCTIONS   FOLLOW-UP:  Please make an appointment with your physician in.  Call your physician immediately if you have any fevers greater than 102.5, drainage from you wound that is not clear or looks infected, persistent bleeding, increasing abdominal pain, problems urinating, or persistent nausea/vomiting.    WOUND CARE INSTRUCTIONS:  Keep a dry clean dressing on the wound if there is drainage. The initial bandage may be removed after 24 hours.  Once the wound has quit draining you may leave it open to air.  If clothing rubs against the wound or causes irritation and the wound is not draining you may cover it with a dry dressing during the daytime.  Try to keep the wound dry and avoid ointments on the wound unless directed to do so.  If the wound becomes bright red and painful or starts to drain infected material that is not clear, please contact your physician immediately.  If the wound is mildly pink and has a thick firm ridge underneath it, this is normal, and is referred to as a healing ridge.  This will resolve over the next 4-6 weeks.  DIET:  You may eat any foods that you can tolerate.  It is a good idea to eat a high fiber diet and take in plenty of fluids to prevent constipation.  If you do become constipated you may want to take a mild laxative or take ducolax tablets on a daily basis until your bowel habits are regular.  Constipation can be very uncomfortable, along with straining, after recent surgery.  ACTIVITY:  You are encouraged to cough and deep breath or use your incentive spirometer if you were given one, every 15-30 minutes when awake.  This will help prevent respiratory complications and low grade fevers  post-operatively if you had a general anesthetic.  You may want to hug a pillow when coughing and sneezing to add additional support to the surgical area, if you had abdominal or chest surgery, which will decrease pain during these times.  You are encouraged to walk and engage in light activity for the next two weeks.  You should not lift more than 20 pounds during this time frame as it could put you at increased risk for complications.  Twenty pounds is roughly equivalent to a plastic bag of groceries.    MEDICATIONS:  Try to take narcotic medications and anti-inflammatory medications, such as tylenol, ibuprofen, naprosyn, etc., with food.  This will minimize stomach upset from the medication.  Should you develop nausea and vomiting from the pain medication, or develop a rash, please discontinue the medication and contact your physician.  You should not drive, make important decisions, or operate machinery when taking narcotic pain medication.  QUESTIONS:  Please feel free to call your physician or the hospital operator if you have any questions, and they will be glad to assist you.

## 2020-01-07 ENCOUNTER — Encounter: Payer: Self-pay | Admitting: Certified Nurse Midwife

## 2020-01-07 ENCOUNTER — Ambulatory Visit (INDEPENDENT_AMBULATORY_CARE_PROVIDER_SITE_OTHER): Payer: Managed Care, Other (non HMO) | Admitting: Certified Nurse Midwife

## 2020-01-07 ENCOUNTER — Other Ambulatory Visit (HOSPITAL_COMMUNITY)
Admission: RE | Admit: 2020-01-07 | Discharge: 2020-01-07 | Disposition: A | Discharge status: Home / Self Care | Payer: Managed Care, Other (non HMO) | Source: Ambulatory Visit | Attending: Certified Nurse Midwife | Admitting: Certified Nurse Midwife

## 2020-01-07 ENCOUNTER — Other Ambulatory Visit: Payer: Self-pay

## 2020-01-07 VITALS — BP 110/70 | HR 86 | Resp 16 | Ht 64.0 in | Wt 128.8 lb

## 2020-01-07 DIAGNOSIS — Z124 Encounter for screening for malignant neoplasm of cervix: Secondary | ICD-10-CM

## 2020-01-07 NOTE — Progress Notes (Signed)
Postpartum Visit  Chief Complaint: No chief complaint on file.   History of Present Illness: Maureen Black is a 29 y.o. G1 P1001 BF who presents for her 6 week postpartum visit.  Date of delivery: 11/24/2019 Type of delivery: Vaginal delivery - Vacuum or forceps assisted  no Episiotomy No.  Laceration: yes, second degree perineal and bilateral labial lacerations  Pregnancy or labor problems:  Yes, had IOL for suspected IUGR Any problems since the delivery:  Yes, had acute cholecystitis, and had a robotic assisted laparoscopic cholecystectomy 12/16/2019  Newborn Details:  SINGLETON :  1. Baby's name: Elijah. Birth weight: 6#4oz Maternal Details:  Breast Feeding:  no Post partum depression/anxiety noted:  no Edinburgh Post-Partum Depression Score:  4  Date of last PAP: 05/22/2018  normal   Review of Systems: ROS  Past Medical History:  Past Medical History:  Diagnosis Date  . Abnormal Pap smear of cervix 2012  . Alpha thalassemia silent carrier   . Constipation     Past Surgical History:  Past Surgical History:  Procedure Laterality Date  . DG SELECTED HSG GDC ONLY    . HYSTEROSALPINGOGRAM      Family History:  Family History  Problem Relation Age of Onset  . Hypertension Mother   . Diabetes Maternal Grandmother   . Lung cancer Paternal Grandmother     Social History:  Social History   Socioeconomic History  . Marital status: Married    Spouse name: Armed forces logistics/support/administrative officer  . Number of children: Not on file  . Years of education: Not on file  . Highest education level: Not on file  Occupational History  . Not on file  Tobacco Use  . Smoking status: Never Smoker  . Smokeless tobacco: Never Used  Vaping Use  . Vaping Use: Never used  Substance and Sexual Activity  . Alcohol use: Not Currently    Comment: socially  . Drug use: No  . Sexual activity: Yes    Partners: Male    Birth control/protection: None  Other Topics Concern  . Not on file   Social History Narrative  . Not on file   Social Determinants of Health   Financial Resource Strain:   . Difficulty of Paying Living Expenses:   Food Insecurity:   . Worried About Programme researcher, broadcasting/film/video in the Last Year:   . Barista in the Last Year:   Transportation Needs:   . Freight forwarder (Medical):   Marland Kitchen Lack of Transportation (Non-Medical):   Physical Activity:   . Days of Exercise per Week:   . Minutes of Exercise per Session:   Stress:   . Feeling of Stress :   Social Connections:   . Frequency of Communication with Friends and Family:   . Frequency of Social Gatherings with Friends and Family:   . Attends Religious Services:   . Active Member of Clubs or Organizations:   . Attends Banker Meetings:   Marland Kitchen Marital Status:   Intimate Partner Violence:   . Fear of Current or Ex-Partner:   . Emotionally Abused:   Marland Kitchen Physically Abused:   . Sexually Abused:     Allergies:  No Known Allergies  Medications: Prenatal vitamins  Physical Exam Vitals: BP 110/70   Pulse 86   Resp 16   Ht 5\' 4"  (1.626 m)   Wt 128 lb 12.8 oz (58.4 kg)   SpO2 98%   Breastfeeding No   BMI  22.11 kg/m  General: NAD HEENT: normocephalic, anicteric Neck: No thyroid enlargement, no palpable nodules, no cervical lymphadenpathy Breast: right breast larger than left, no inflammation, no masses, nipples intact Pulmonary: No increased work of breathing, CTAB Heart: RRR without murmur Abdomen: Soft, non-tender, non-distended.  Umbilicus without lesions.  No hepatomegaly or masses palpable. No evidence of hernia. Port scars from recent surgery healing well. Genitourinary:  External: Well healed perineum/ labia, no lesions or inflammation    Vagina: Normal vaginal mucosa, no evidence of prolapse.    Cervix: Grossly normal in appearance, no bleeding  Uterus: AV, deviated to the left,well involuted, mobile, non-tender  Adnexa: No adnexal masses, non-tender  Rectal:  deferred Extremities: no edema, erythema, or tenderness Neurologic: Grossly intact Psychiatric: mood appropriate, affect full  Assessment: 29 y.o. G1P1001 presenting for 6 week postpartum visit-normal involution  Plan:  1) Contraception Education: conceived by IVF, female infertility factor. Does not desire contraception  2)  Pap done.  ASCCP guidelines and rational discussed.  Patient opts for yearly screening interval.  3) Patient underwent screening for postpartum depression with no concerns noted.  4) Discussed return to normal activity, recommend continuing prenatal vitamins.  5) Follow up 1 year for routine annual exam.  Dalia Heading, CNM

## 2020-01-09 LAB — CYTOLOGY - PAP
Comment: NEGATIVE
Diagnosis: NEGATIVE
High risk HPV: NEGATIVE

## 2020-09-01 ENCOUNTER — Ambulatory Visit (INDEPENDENT_AMBULATORY_CARE_PROVIDER_SITE_OTHER): Payer: Managed Care, Other (non HMO)

## 2020-09-01 ENCOUNTER — Encounter: Payer: Self-pay | Admitting: Emergency Medicine

## 2020-09-01 ENCOUNTER — Ambulatory Visit
Admission: EM | Admit: 2020-09-01 | Discharge: 2020-09-01 | Disposition: A | Discharge status: Home / Self Care | Payer: Managed Care, Other (non HMO) | Attending: Emergency Medicine | Admitting: Emergency Medicine

## 2020-09-01 ENCOUNTER — Other Ambulatory Visit: Payer: Self-pay

## 2020-09-01 DIAGNOSIS — M79641 Pain in right hand: Secondary | ICD-10-CM | POA: Diagnosis not present

## 2020-09-01 DIAGNOSIS — M19041 Primary osteoarthritis, right hand: Secondary | ICD-10-CM

## 2020-09-01 MED ORDER — METHYLPREDNISOLONE 4 MG PO TBPK
ORAL_TABLET | ORAL | 0 refills | Status: DC
Start: 2020-09-01 — End: 2020-12-16

## 2020-09-01 MED ORDER — HYDROCODONE-ACETAMINOPHEN 5-325 MG PO TABS: 1.0000 | ORAL_TABLET | Freq: Four times a day (QID) | ORAL | 0 refills | Status: DC | PRN

## 2020-09-01 NOTE — ED Provider Notes (Signed)
[facility]    CSN: [id number] Arrival date & time: 09/01/20  1718      History   Chief Complaint Chief Complaint  Patient presents with  . Hand Pain  . hand swelling    right    HPI Maureen Black is a 30 y.o. female.   HPI   30 year old female here for evaluation of right hand pain and swelling.  Patient reports that she developed swelling and pain of her right hand this morning when she woke up.  Patient denies any injury.  The pain is confined to the right thumb right index finger and right middle finger as well as the swelling.  Patient denies weakness or fever.  Patient denies any new medications or supplements.  Patient does have numbness and tingling of her tip of her index finger.  Past Medical History:  Diagnosis Date  . Abdominal pain affecting pregnancy 11/09/2019  . Abnormal Pap smear of cervix 2012  . Alpha thalassemia silent carrier   . Constipation   . Intrauterine growth restriction (IUGR) affecting care of mother, third trimester, single gestation 11/23/2019  . Supervision of high risk pregnancy, antepartum 05/01/2019   Clinic [facility] Prenatal Labs Dating Embryo transfer Blood type: O+ Genetic Screen NIPS: normal XY Antibody: Negative Anatomic [place] Normal Rubella: Immune Varicella: [ ]  needs GTT 50 RPR: NR Rhogam n/a HBsAg: neg TDaP vaccine 09/23/2019  Flu Shot: HIV: negative Baby Food                                11/23/2019-- (04/23 1640) Contraception  Pap: NILM 1 year ago (05/2018) CBB    CS/VBAC N/A  Suppor    Patient Active Problem List   Diagnosis Date Noted  . Cholecystitis 12/16/2019  . Acute cholecystitis 12/16/2019  . Normal vaginal delivery 11/24/2019  . Pregnancy conceived through in vitro fertilization 05/01/2019    Past Surgical History:  Procedure Laterality Date  . DG SELECTED HSG GDC ONLY    . HYSTEROSALPINGOGRAM      OB History    Gravida  1   Para  1   Term  1   Preterm  0   AB  0   Living  1      SAB  0   IAB  0   Ectopic  0   Multiple  0   Live Births  1            Home Medications    Prior to Admission medications   Medication Sig Start Date End Date Taking? Authorizing Provider  HYDROcodone-acetaminophen (NORCO/VICODIN) 5-325 MG tablet Take 1 tablet by mouth every 6 (six) hours as needed. 09/01/20  Yes 09/03/20, NP  methylPREDNISolone (MEDROL DOSEPAK) 4 MG TBPK tablet Take according to the package insert. 09/01/20  Yes 09/03/20, NP    Family History Family History  Problem Relation Age of Onset  . Hypertension Mother   . Healthy Father   . Diabetes Maternal Grandmother   . Lung cancer Paternal Grandmother     Social History Social History   Tobacco Use  . Smoking status: Never Smoker  . Smokeless tobacco: Never Used  Vaping Use  . Vaping Use: Never used  Substance Use Topics  . Alcohol use: Yes    Comment: socially  . Drug use: No     Allergies   Patient has no known allergies.   Review of Systems  Review of Systems  Constitutional: Negative for fever.  Musculoskeletal: Positive for arthralgias, joint swelling and myalgias.  Skin: Positive for color change.  Neurological: Positive for numbness. Negative for weakness.  Hematological: Negative.   Psychiatric/Behavioral: Negative.      Physical Exam Triage Vital Signs ED Triage Vitals  Enc Vitals Group     BP 09/01/20 1731 120/82     Pulse Rate 09/01/20 1731 67     Resp 09/01/20 1731 18     Temp 09/01/20 1731 98.3 F (36.8 C)     Temp Source 09/01/20 1731 Oral     SpO2 09/01/20 1731 100 %     Weight 09/01/20 1732 125 lb (56.7 kg)     Height 09/01/20 1732 5\' 4"  (1.626 m)     Head Circumference --      Peak Flow --      Pain Score 09/01/20 1730 10     Pain Loc --      Pain Edu? --      Excl. in GC? --    No data found.  Updated Vital Signs BP 120/82 (BP Location: Left Arm)   Pulse 67   Temp 98.3 F (36.8 C) (Oral)   Resp 18   Ht 5\' 4"  (1.626 m)   Wt 125 lb (56.7  kg)   LMP 08/11/2020 (Approximate)   SpO2 100%   Breastfeeding No   BMI 21.46 kg/m   Visual Acuity Right Eye Distance:   Left Eye Distance:   Bilateral Distance:    Right Eye Near:   Left Eye Near:    Bilateral Near:     Physical Exam Vitals and nursing note reviewed.  Constitutional:      General: She is not in acute distress.    Appearance: Normal appearance. She is not ill-appearing.  HENT:     Head: Normocephalic and atraumatic.  Musculoskeletal:        General: Swelling and tenderness present. No signs of injury.  Skin:    General: Skin is warm and dry.     Capillary Refill: Capillary refill takes less than 2 seconds.     Findings: Erythema present. No bruising.  Neurological:     General: No focal deficit present.     Mental Status: She is alert and oriented to person, place, and time.     Sensory: No sensory deficit.     Motor: No weakness.  Psychiatric:        Mood and Affect: Mood normal.        Behavior: Behavior normal.        Thought Content: Thought content normal.        Judgment: Judgment normal.      UC Treatments / Results  Labs (all labs ordered are listed, but only abnormal results are displayed) Labs Reviewed - No data to display  EKG   Radiology DG Hand Complete Right  Result Date: 09/01/2020 CLINICAL DATA:  Pain and swelling EXAM: RIGHT HAND - COMPLETE 3+ VIEW COMPARISON:  None. FINDINGS: Frontal, oblique, lateral views of the right hand are obtained. No fracture, subluxation, or dislocation. 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 Remaining joint spaces are well preserved. Soft tissues are unremarkable. IMPRESSION: 1. No acute bony abnormality. Electronically Signed   By: 08/13/2020 M.D.   On: 09/01/2020 18:23    Procedures Procedures (including critical care time)  Medications Ordered in UC Medications - No data to display  Initial Impression / Assessment and Plan /  UC Course  I have reviewed the triage  vital signs and the nursing notes.  Pertinent labs & imaging results that were available during my care of the patient were reviewed by me and considered in my medical decision making (see chart for details).   Patient is a very pleasant yet tearful 30 year old female here for evaluation of pain and swelling to the thumb, index finger, and middle finger of her right hand that were present this morning when she woke up.  Patient is unaware of any injury but she does spend 10 hours a day working from home on the computer and using the mouse.  Patient has marked tenderness to the MCP joint of the thumb index and middle finger but no tenderness or swelling to the MCP joints of the fourth or fifth finger.  Patient also has extreme sensitivity to the volar aspect of the index finger and thumb but less so to the volar aspect of the remaining 3 fingers of the right hand.  The MCP joint joints of the thumb, index, and middle finger and webspace of the thumb are swollen.  There is some mild erythema in a linear streak at the proximal aspect of the thenar eminence of the right thumb.  The joints are not hot or erythematous.  Suspect that this is a repetitive motion injury and arthropathy.  Will obtain radiograph of right hand to rule out inflammatory process within any of the joints.   Radiology read of right hand films is negative for fracture, dislocation, or erosive process.  Will discharge patient home with arthropathy of the right hand and treat her with steroid pack.  Final Clinical Impressions(s) / UC Diagnoses   Final diagnoses:  Arthropathy of right hand     Discharge Instructions     There were no signs of fractures, dislocations, or joint inflammation or erosion on your x-rays.  Your pain and swelling may be secondary to repetitive use from your work on the computer.  Try taking breaks every 30 minutes to an hour to give your hand a break and decrease inflammation.  Keep your right hand  elevated as much as possible.  Start the Medrol Dosepak tomorrow morning with breakfast and take according to the package insert.  Use the Norco as needed for this evening and for severe pain.  If your symptoms do not improve, or new symptoms develop return for reevaluation or follow-up with orthopedics.    ED Prescriptions    Medication Sig Dispense Auth. Provider   methylPREDNISolone (MEDROL DOSEPAK) 4 MG TBPK tablet Take according to the package insert. 1 each Becky Augusta, NP   HYDROcodone-acetaminophen (NORCO/VICODIN) 5-325 MG tablet Take 1 tablet by mouth every 6 (six) hours as needed. 10 tablet Becky Augusta, NP     I have reviewed the PDMP during this encounter.   Becky Augusta, NP 09/01/20 1858

## 2020-09-01 NOTE — Discharge Instructions (Addendum)
There were no signs of fractures, dislocations, or joint inflammation or erosion on your x-rays.  Your pain and swelling may be secondary to repetitive use from your work on the computer.  Try taking breaks every 30 minutes to an hour to give your hand a break and decrease inflammation.  Keep your right hand elevated as much as possible.  Start the Medrol Dosepak tomorrow morning with breakfast and take according to the package insert.  Use the Norco as needed for this evening and for severe pain.  If your symptoms do not improve, or new symptoms develop return for reevaluation or follow-up with orthopedics.

## 2020-09-01 NOTE — ED Triage Notes (Signed)
Patient in today c/o right hand/finger pain and swelling x today. Patient denies any injury. She states she woke up like this today. Patient has not taken any OTC medications to help with her symptoms.

## 2020-12-16 ENCOUNTER — Encounter: Payer: Self-pay | Admitting: Obstetrics

## 2020-12-16 ENCOUNTER — Other Ambulatory Visit: Payer: Self-pay

## 2020-12-16 ENCOUNTER — Ambulatory Visit (INDEPENDENT_AMBULATORY_CARE_PROVIDER_SITE_OTHER): Payer: Managed Care, Other (non HMO) | Admitting: Obstetrics

## 2020-12-16 VITALS — BP 90/60 | Ht 64.0 in | Wt 121.0 lb

## 2020-12-16 DIAGNOSIS — R399 Unspecified symptoms and signs involving the genitourinary system: Secondary | ICD-10-CM | POA: Diagnosis not present

## 2020-12-16 DIAGNOSIS — R319 Hematuria, unspecified: Secondary | ICD-10-CM | POA: Diagnosis not present

## 2020-12-16 LAB — POCT URINALYSIS DIPSTICK

## 2020-12-16 MED ORDER — NITROFURANTOIN MONOHYD MACRO 100 MG PO CAPS: 100.0000 mg | ORAL_CAPSULE | Freq: Two times a day (BID) | ORAL | 0 refills | Status: AC

## 2020-12-16 NOTE — Progress Notes (Signed)
Obstetrics & Gynecology Office Visit   Chief Complaint:  Chief Complaint  Patient presents with  . Urinary Tract Infection    Frequency and painful urination since yesterday    History of Present Illness: Maureen Black presents with UTI symptoms. She has had UTIs in the past, and shares that the suprapubic pressure and slight urgency and increased frequency of rination are all pointing to a UTI diagnosis. She is also due for an Annual Gyn physical. She had a baby with [facility] last year, but has not been back since her postpartum period. Her symptoms started several days ago, and she started taking AZO.  She admits to drinking largely soda, and little water.   Review of Systems:  Review of Systems  Constitutional: Negative.   HENT: Negative.   Eyes: Negative.   Respiratory: Negative.   Cardiovascular: Negative.   Gastrointestinal: Positive for abdominal pain.  Genitourinary: Positive for dysuria, hematuria and urgency.  Musculoskeletal: Negative.   Skin: Negative.   Neurological: Negative.   Endo/Heme/Allergies: Negative.   Psychiatric/Behavioral: Negative.      Past Medical History:  Past Medical History:  Diagnosis Date  . Abdominal pain affecting pregnancy 11/09/2019  . Abnormal Pap smear of cervix 2012  . Alpha thalassemia silent carrier   . Constipation   . Intrauterine growth restriction (IUGR) affecting care of mother, third trimester, single gestation 11/23/2019  . Pregnancy conceived through in vitro fertilization 05/01/2019   [x]  ECHO at 22 weeks [  ] Growth third trimester [  ] IOL by 40 weeks  . Supervision of high risk pregnancy, antepartum 05/01/2019   Clinic [facility] Prenatal Labs Dating Embryo transfer Blood type: O+ Genetic Screen NIPS: normal XY Antibody: Negative Anatomic 05/03/2019 Normal Rubella: Immune Varicella: [ ]  needs GTT 50 RPR: NR Rhogam n/a HBsAg: neg TDaP vaccine 09/23/2019  Flu Shot: HIV: negative Baby Food                                --  (04/23 1640) Contraception  Pap: NILM 1 year ago (05/2018) CBB    CS/VBAC N/A  Suppor    Past Surgical History:  Past Surgical History:  Procedure Laterality Date  . DG SELECTED HSG GDC ONLY    . HYSTEROSALPINGOGRAM      Gynecologic History: Patient's last menstrual period was 11/26/2020 (exact date).  Obstetric History: G1P1001  Family History:  Family History  Problem Relation Age of Onset  . Hypertension Mother   . Healthy Father   . Diabetes Maternal Grandmother   . Lung cancer Paternal Grandmother     Social History:  Social History   Socioeconomic History  . Marital status: Married    Spouse name: 06/2018  . Number of children: Not on file  . Years of education: Not on file  . Highest education level: Not on file  Occupational History  . Not on file  Tobacco Use  . Smoking status: Never Smoker  . Smokeless tobacco: Never Used  Vaping Use  . Vaping Use: Never used  Substance and Sexual Activity  . Alcohol use: Yes    Comment: socially  . Drug use: No  . Sexual activity: Yes    Partners: Male    Birth control/protection: None  Other Topics Concern  . Not on file  Social History Narrative  . Not on file   Social Determinants of Health   Financial Resource Strain: Not on  file  Food Insecurity: Not on file  Transportation Needs: Not on file  Physical Activity: Not on file  Stress: Not on file  Social Connections: Not on file  Intimate Partner Violence: Not on file    Allergies:  No Known Allergies  Medications: Prior to Admission medications   Not on File    Physical Exam Vitals:  Vitals:   12/16/20 1549  BP: 90/60   Patient's last menstrual period was 11/26/2020 (exact date).  Physical Exam Vitals reviewed.  Constitutional:      Appearance: Normal appearance. She is normal weight.  HENT:     Head: Normocephalic and atraumatic.     Nose: Nose normal.  Cardiovascular:     Rate and Rhythm: Normal rate and regular rhythm.      Pulses: Normal pulses.     Heart sounds: Normal heart sounds.  Pulmonary:     Effort: Pulmonary effort is normal.     Breath sounds: Normal breath sounds.  Abdominal:     Palpations: Abdomen is soft.     Tenderness: There is abdominal tenderness.     Comments: Tenderness suprapubically.  Genitourinary:    Comments: Exam deferred Musculoskeletal:        General: Normal range of motion.     Cervical back: Normal range of motion and neck supple.  Skin:    General: Skin is warm and dry.  Neurological:     General: No focal deficit present.     Mental Status: She is alert and oriented to person, place, and time.  Psychiatric:        Mood and Affect: Mood normal.        Behavior: Behavior normal.   No flank pain noted. POCT urine: 3+ blood; urine is orange in color (AZO), nefgative for leuks, nitrites today.  Assessment: 30 y.o. G1P10  With UTI symptoms Hematuria   Plan: Problem List Items Addressed This Visit   None   Visit Diagnoses    UTI symptoms    -  Primary   Relevant Orders   POCT Urinalysis Dipstick (Completed)   Urine Culture   Hematuria, unspecified type       Relevant Orders   Urine Culture     Started on Macrobid, and will contact her with the results of her urine culture. She is encouraged to RTC for an Annual Exam.  Mirna Mires, CNM  12/16/2020 4:52 PM

## 2020-12-22 LAB — URINE CULTURE

## 2021-01-07 ENCOUNTER — Ambulatory Visit: Payer: Managed Care, Other (non HMO) | Admitting: Obstetrics and Gynecology

## 2021-01-13 ENCOUNTER — Ambulatory Visit: Payer: Managed Care, Other (non HMO) | Admitting: Advanced Practice Midwife

## 2021-02-18 ENCOUNTER — Ambulatory Visit: Payer: Managed Care, Other (non HMO) | Admitting: Advanced Practice Midwife

## 2021-06-22 IMAGING — MR MR ABDOMEN WO/W CM MRCP
19 of 20 series · 45 of 48 positions shown · IV contrast (5ml Gadavist)
Comparison: Ultrasound 12/16/2019

CLINICAL DATA: Right upper quadrant and epigastric pain
intermittently. Associated nausea vomiting. No fever or chills.



[Series 3: T2 · coronal · 6.0mm · 1.19mm/px · 2 of 25 slices shown (1 of 2)]
[im 1/25]
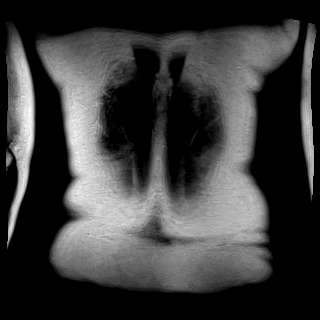
[im 25/25]
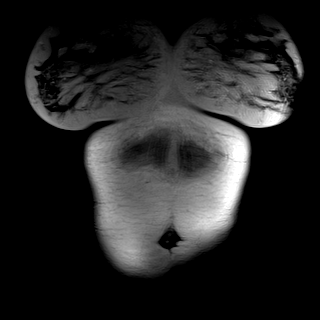

[Series 4: T2 · axial · 6.0mm · 1.12mm/px · z∈[-117,+106]mm · 3 of 32 slices shown (2 of 2)]
[im 1/32]
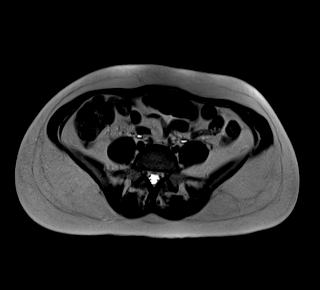
[im 16/32]
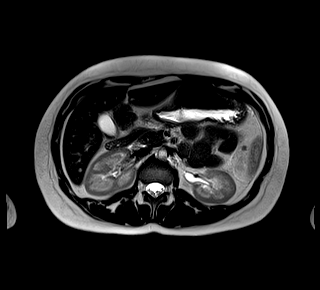
[im 32/32]
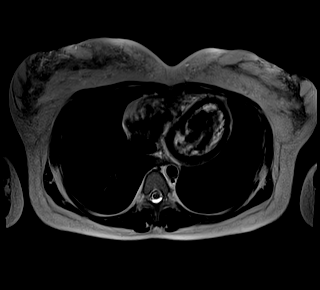

[Series 5: T1 · axial · 6.0mm · 0.70mm/px · 1 of 30 slices shown (1 of 2)]
[im 1/30]
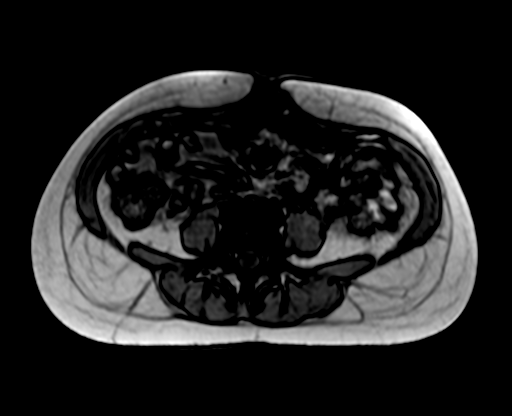

[Series 5: T1 · axial · 6.0mm · 0.70mm/px · 1 of 30 slices shown (2 of 2)]
[im 1/30]
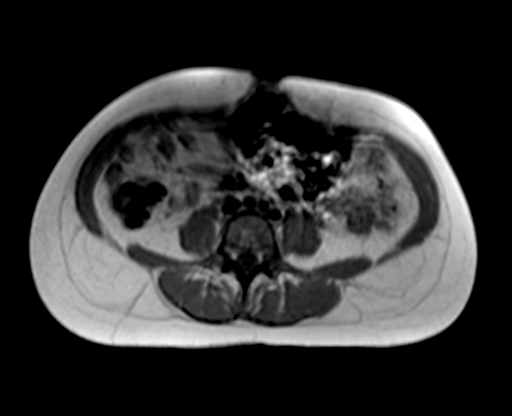

[Series 8: T2 fat-sat · axial · 6.0mm · 1.19mm/px · 1 of 30 slices shown]
[im 1/30]
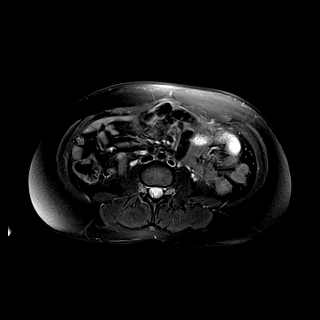

[Series 9: ax dwi_tracew · axial · 6.0mm · 1.42mm/px · z∈[-69,+139]mm · 4 of 90 slices shown]
[im 1/90]
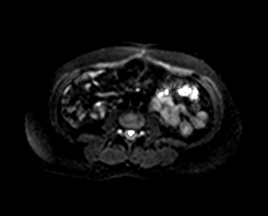
[im 30/90]
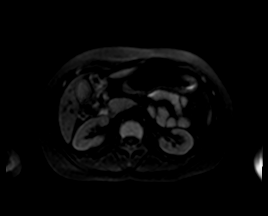
[im 60/90]
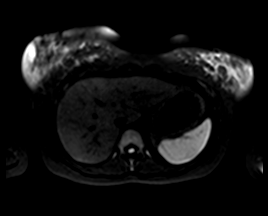
[im 90/90]
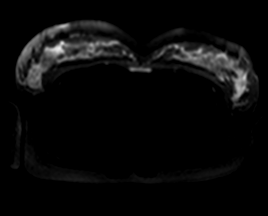

[Series 10: ax dwi_adc · axial · 6.0mm · 1.42mm/px · 1 of 30 slices shown]
[im 1/30]
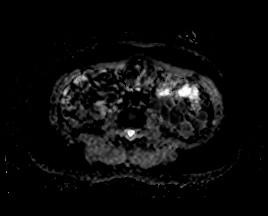

[Series 14: MRCP · coronal · 3.0mm · 1.12mm/px · 1 of 17 slices shown]
[im 1/17]
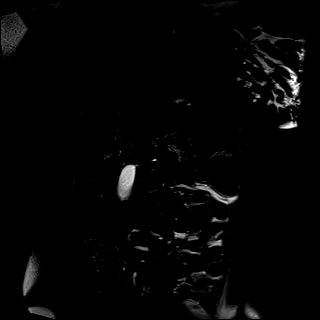

[Series 15: radials · coronal · 50.0mm · 0.78mm/px · 1 of 5 slices shown]
[im 1/5]
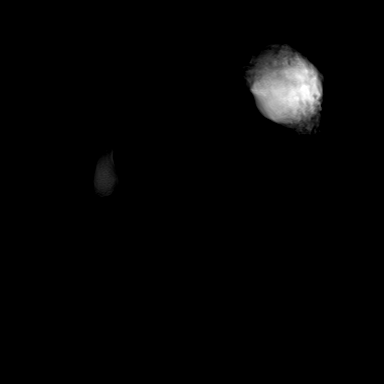

[Series 16: T1 dynamic fat-sat · axial · non-contrast · 3.0mm · 1.19mm/px · z∈[-80,+109]mm · 3 of 64 slices shown (1 of 5)]
[im 1/64]
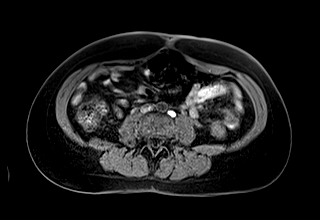
[im 32/64]
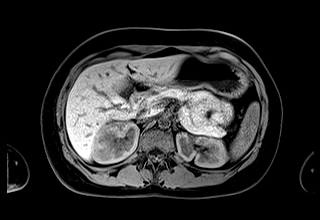
[im 64/64]
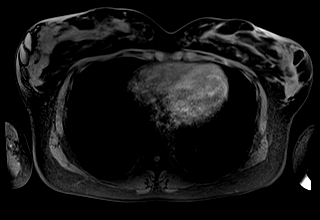

[Series 17: T1 dynamic fat-sat post-contrast · axial · 3.0mm · 1.19mm/px · z∈[-80,+109]mm · 3 of 64 slices shown (1 of 4)]
[im 1/64]
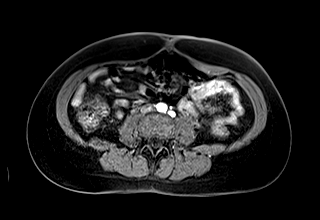
[im 32/64]
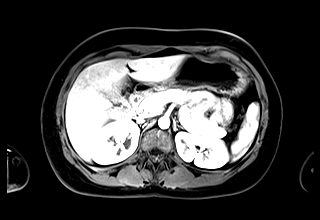
[im 64/64]
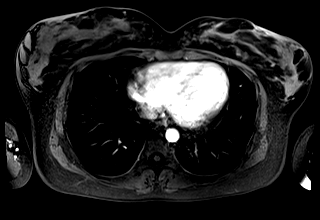

[Series 18: T1 dynamic fat-sat · axial · 3.0mm · 1.19mm/px · z∈[-80,+109]mm · 3 of 64 slices shown (2 of 5)]
[im 1/64]
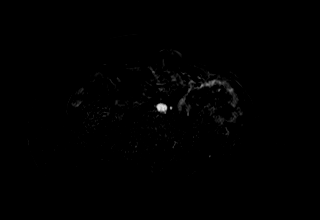
[im 32/64]
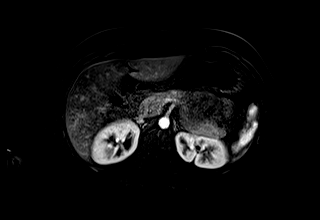
[im 64/64]
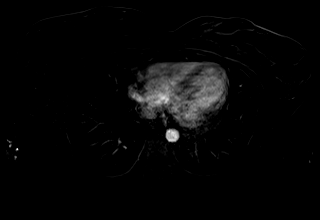

[Series 19: T1 dynamic fat-sat post-contrast · axial · 3.0mm · 1.19mm/px · z∈[-80,+109]mm · 3 of 64 slices shown (2 of 4)]
[im 1/64]
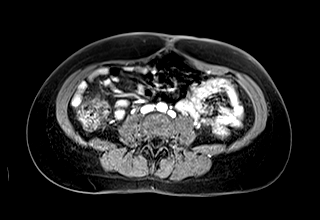
[im 32/64]
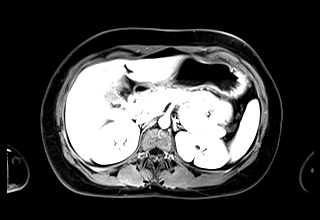
[im 64/64]
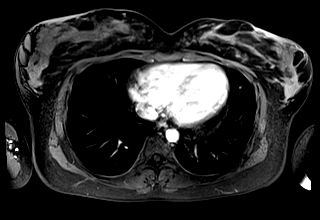

[Series 20: T1 dynamic fat-sat · axial · 3.0mm · 1.19mm/px · z∈[-80,+109]mm · 3 of 64 slices shown (3 of 5)]
[im 1/64]
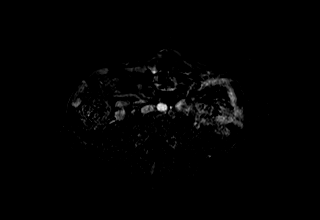
[im 32/64]
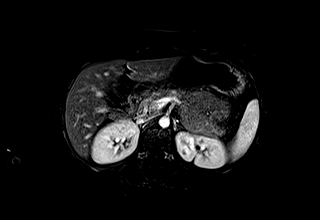
[im 64/64]
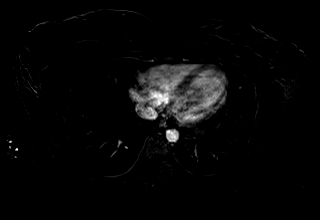

[Series 21: T1 dynamic fat-sat post-contrast · axial · 3.0mm · 1.19mm/px · z∈[-80,+109]mm · 3 of 64 slices shown (3 of 4)]
[im 1/64]
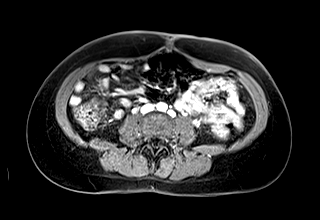
[im 32/64]
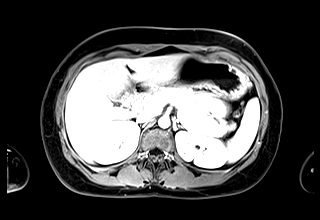
[im 64/64]
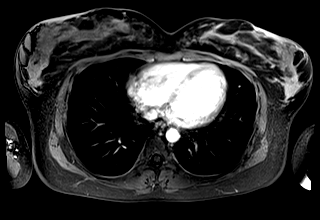

[Series 22: T1 dynamic fat-sat · axial · 3.0mm · 1.19mm/px · z∈[-80,+109]mm · 3 of 64 slices shown (4 of 5)]
[im 1/64]
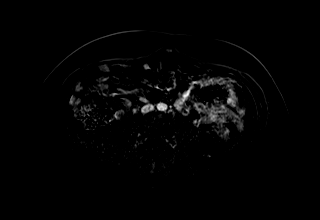
[im 32/64]
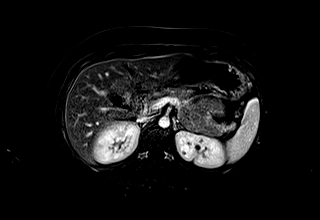
[im 64/64]
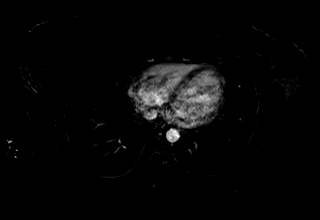

[Series 23: T1 dynamic post-contrast · coronal · 3.0mm · 1.31mm/px · 3 of 60 slices shown]
[im 1/60]
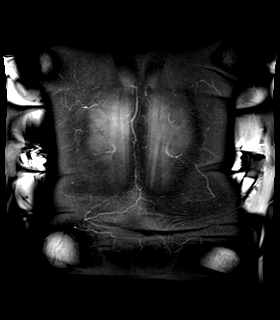
[im 30/60]
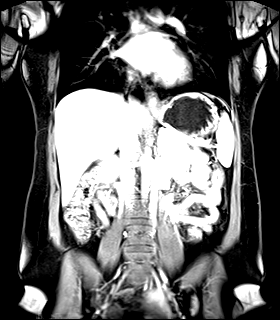
[im 60/60]
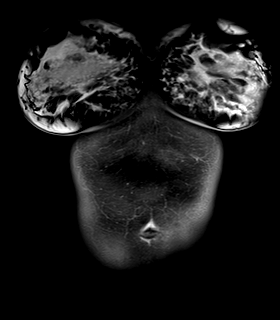

[Series 24: T1 dynamic fat-sat post-contrast · axial · 3.0mm · 1.19mm/px · z∈[-80,+109]mm · 3 of 64 slices shown (4 of 4)]
[im 1/64]
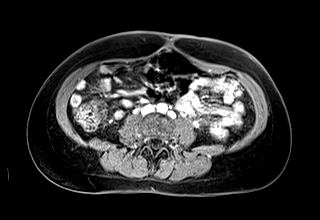
[im 32/64]
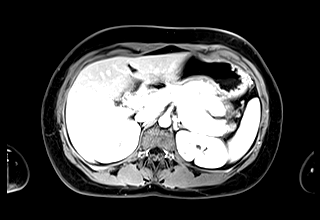
[im 64/64]
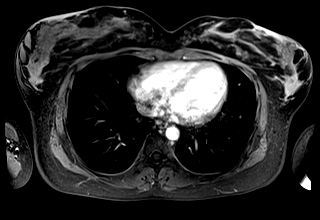

[Series 25: T1 dynamic fat-sat · axial · 3.0mm · 1.19mm/px · z∈[-80,+109]mm · 3 of 64 slices shown (5 of 5)]
[im 1/64]
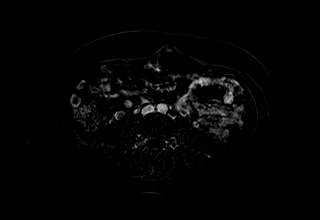
[im 32/64]
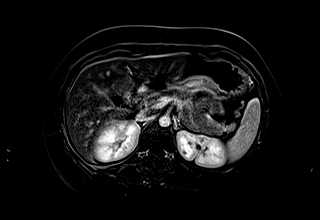
[im 64/64]
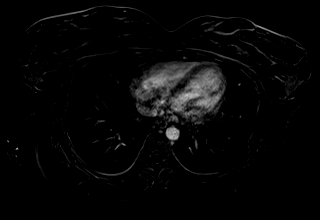

[45 of 48 positions shown; findings below may reference images not displayed]

FINDINGS: Lower chest: The lung bases are clear. No pulmonary lesions or
infiltrates. No pleural or pericardial effusion.

Hepatobiliary: No hepatic lesions or intrahepatic biliary
dilatation. The gallbladder demonstrates small layering gallstones
but no MR findings for acute cholecystitis. Normal caliber and
course of the common bile duct. No common bile duct dilatation or
stones.

Pancreas:  Normal

Spleen:  Normal

Adrenals/Urinary Tract: The adrenal glands and kidneys are
unremarkable. No renal lesions or hydronephrosis.

Stomach/Bowel: The stomach, duodenum, visualized small bowel and
visualized colon are unremarkable.

Vascular/Lymphatic: The aorta and branch vessels are normal. Patient
does have a duplicated IVC. No mesenteric or retroperitoneal mass or
adenopathy.

Other: Small periumbilical abdominal wall hernia containing fat and
a small bowel loop. No evidence of incarceration or obstruction.

Musculoskeletal: No significant bony findings.
IMPRESSION: 1. Cholelithiasis but no MR findings for acute cholecystitis. Normal
caliber and course of the common bile duct. No common bile duct
stones.
2. No acute abdominal findings, mass lesions or adenopathy.
3. Small periumbilical abdominal wall hernia containing fat and a
small bowel loop. No evidence of incarceration or obstruction.

## 2022-07-21 ENCOUNTER — Ambulatory Visit: Admission: EM | Admit: 2022-07-21 | Payer: Managed Care, Other (non HMO)

## 2022-07-21 ENCOUNTER — Ambulatory Visit
Admission: EM | Admit: 2022-07-21 | Discharge: 2022-07-21 | Disposition: A | Discharge status: Home / Self Care | Payer: BC Managed Care – PPO | Attending: Physician Assistant | Admitting: Physician Assistant

## 2022-07-21 DIAGNOSIS — R112 Nausea with vomiting, unspecified: Secondary | ICD-10-CM | POA: Diagnosis not present

## 2022-07-21 DIAGNOSIS — A084 Viral intestinal infection, unspecified: Secondary | ICD-10-CM | POA: Diagnosis not present

## 2022-07-21 DIAGNOSIS — R197 Diarrhea, unspecified: Secondary | ICD-10-CM | POA: Diagnosis not present

## 2022-07-21 MED ORDER — ONDANSETRON 4 MG PO TBDP: 4.0000 mg | ORAL_TABLET | Freq: Four times a day (QID) | ORAL | 0 refills | Status: DC | PRN

## 2022-07-21 MED ORDER — ONDANSETRON 8 MG PO TBDP
8.0000 mg | ORAL_TABLET | Freq: Once | ORAL | Status: AC
  Administered 2022-07-21: 8 mg via ORAL

## 2022-07-21 NOTE — ED Provider Notes (Signed)
[facility]    CSN: [id number] Arrival date & time: 07/21/22  1745      History   Chief Complaint Chief Complaint  Patient presents with   Emesis   Diarrhea    HPI Maureen Black is a 32 y.o. female presenting for onset of nausea/vomiting and diarrhea as well as fatigue and bodyaches this morning.  She denies any fever, cough, congestion, sore throat, chest pain, shortness of breath.  No urinary symptoms.  Reports that her son has had some vomiting today.  She says that she did cooked [place] yesterday and her husband ate that and ate more than she did and did not get sick.  Her son did not eat it and he is sick.  She has not been taking any medicine for symptoms.  She has no other complaints or concerns.  HPI  Past Medical History:  Diagnosis Date   Abdominal pain affecting pregnancy 11/09/2019   Abnormal Pap smear of cervix 2012   Alpha thalassemia silent carrier    Constipation    Intrauterine growth restriction (IUGR) affecting care of mother, third trimester, single gestation 11/23/2019   Pregnancy conceived through in vitro fertilization 05/01/2019   [x]  ECHO at 22 weeks [  ] Growth [place] third trimester [  ] IOL by 40 weeks   Supervision of high risk pregnancy, antepartum 05/01/2019   Clinic [facility] Prenatal Labs Dating Embryo transfer Blood type: O+ Genetic Screen NIPS: normal XY Antibody: Negative Anatomic [place] Normal Rubella: Immune Varicella: [ ]  needs GTT 50 RPR: NR Rhogam n/a HBsAg: neg TDaP vaccine 09/23/2019  Flu Shot: HIV: negative Baby Food                                GEX:BMWUXLKG/-- (04/23 1640) Contraception  Pap: NILM 1 year ago (05/2018) CBB    CS/VBAC N/A  Suppor    Patient Active Problem List   Diagnosis Date Noted   Cholecystitis 12/16/2019   Acute cholecystitis 12/16/2019   Normal vaginal delivery 11/24/2019    Past Surgical History:  Procedure Laterality Date   DG SELECTED HSG [facility] ONLY     HYSTEROSALPINGOGRAM      OB History      Gravida  1   Para  1   Term  1   Preterm  0   AB  0   Living  1      SAB  0   IAB  0   Ectopic  0   Multiple  0   Live Births  1            Home Medications    Prior to Admission medications   Medication Sig Start Date End Date Taking? Authorizing Provider  ondansetron (ZOFRAN-ODT) 4 MG disintegrating tablet Take 1 tablet (4 mg total) by mouth every 6 (six) hours as needed for nausea or vomiting. 07/21/22  Yes Danton Clap, PA-C    Family History Family History  Problem Relation Age of Onset   Hypertension Mother    Healthy Father    Diabetes Maternal Grandmother    Lung cancer Paternal Grandmother     Social History Social History   Tobacco Use   Smoking status: Never   Smokeless tobacco: Never  Vaping Use   Vaping Use: Never used  Substance Use Topics   Alcohol use: Yes    Comment: socially   Drug use: No  Allergies   Patient has no known allergies.   Review of Systems Review of Systems  Constitutional:  Positive for appetite change and fatigue. Negative for chills, diaphoresis and fever.  HENT:  Negative for congestion, ear pain, rhinorrhea, sinus pressure, sinus pain and sore throat.   Respiratory:  Negative for cough and shortness of breath.   Gastrointestinal:  Positive for diarrhea, nausea and vomiting. Negative for abdominal pain.  Musculoskeletal:  Positive for myalgias.  Skin:  Negative for rash.  Neurological:  Negative for weakness and headaches.  Hematological:  Negative for adenopathy.     Physical Exam Triage Vital Signs ED Triage Vitals  Enc Vitals Group     BP 07/21/22 1822 122/88     Pulse Rate 07/21/22 1822 (!) 101     Resp 07/21/22 1822 18     Temp 07/21/22 1822 99.2 F (37.3 C)     Temp Source 07/21/22 1822 Oral     SpO2 07/21/22 1822 99 %     Weight 07/21/22 1821 130 lb (59 kg)     Height 07/21/22 1821 5\' 4"  (1.626 m)     Head Circumference --      Peak Flow --      Pain Score 07/21/22 1820 7      Pain Loc --      Pain Edu? --      Excl. in [place]? --    No data found.  Updated Vital Signs BP 122/88 (BP Location: Left Arm)   Pulse (!) 101   Temp 99.2 F (37.3 C) (Oral)   Resp 18   Ht 5\' 4"  (1.626 m)   Wt 130 lb (59 kg)   LMP 07/07/2022   SpO2 99%   BMI 22.31 kg/m     Physical Exam Vitals and nursing note reviewed.  Constitutional:      General: She is not in acute distress.    Appearance: Normal appearance. She is not ill-appearing or toxic-appearing.  HENT:     Head: Normocephalic and atraumatic.     Nose: Nose normal.     Mouth/Throat:     Mouth: Mucous membranes are moist.     Pharynx: Oropharynx is clear.  Eyes:     General: No scleral icterus.       Right eye: No discharge.        Left eye: No discharge.     Conjunctiva/sclera: Conjunctivae normal.  Cardiovascular:     Rate and Rhythm: Regular rhythm. Tachycardia present.     Heart sounds: Normal heart sounds.  Pulmonary:     Effort: Pulmonary effort is normal. No respiratory distress.     Breath sounds: Normal breath sounds.  Abdominal:     Palpations: Abdomen is soft.     Tenderness: There is abdominal tenderness (generalized).  Musculoskeletal:     Cervical back: Neck supple.  Skin:    General: Skin is dry.  Neurological:     General: No focal deficit present.     Mental Status: She is alert. Mental status is at baseline.     Motor: No weakness.     Gait: Gait normal.  Psychiatric:        Mood and Affect: Mood normal.        Behavior: Behavior normal.        Thought Content: Thought content normal.      UC Treatments / Results  Labs (all labs ordered are listed, but only abnormal results are displayed) Labs Reviewed - No  data to display  EKG   Radiology No results found.  Procedures Procedures (including critical care time)  Medications Ordered in UC Medications  ondansetron (ZOFRAN-ODT) disintegrating tablet 8 mg (8 mg Oral Given 07/21/22 1834)    Initial Impression /  Assessment and Plan / UC Course  I have reviewed the triage vital signs and the nursing notes.  Pertinent labs & imaging results that were available during my care of the patient were reviewed by me and considered in my medical decision making (see chart for details).   32 year old female presents for fatigue, body aches, nausea/vomiting and diarrhea that began this morning.  Son has similar symptoms.  No URI symptoms or other symptoms.  Vitals are stable.  She is afebrile and overall well-appearing.  On exam chest clear auscultation heart regular rate and rhythm.  Normal HEENT exam.  Abdomen is soft with generalized tenderness palpation.  No guarding or rebound.  Suspect viral gastroenteritis.  Will treat symptoms at this time.  Patient given 8 mg ODT Zofran in clinic for symptoms and sent prescription for ODT Zofran to pharmacy.  Encouraged plenty rest and fluids, bland diet.  Reviewed return and ER precautions.   Final Clinical Impressions(s) / UC Diagnoses   Final diagnoses:  Viral gastroenteritis  Nausea vomiting and diarrhea     Discharge Instructions      ABDOMINAL PAIN: You may take Tylenol for pain relief. Use medications as directed including antiemetics and antidiarrheal medications if suggested or prescribed. You should increase fluids and electrolytes as well as rest over these next several days. If you have any questions or concerns, or if your symptoms are not improving or if especially if they acutely worsen, please call or stop back to the clinic immediately and we will be happy to help you or go to the ER   ABDOMINAL PAIN RED FLAGS: Seek immediate further care if: symptoms remain the same or worsen over the next 3-7 days, you are unable to keep fluids down, you see blood or mucus in your stool, you vomit black or dark red material, you have a fever of 101.F or higher, you have localized and/or persistent abdominal pain       ED Prescriptions     Medication Sig  Dispense Auth. Provider   ondansetron (ZOFRAN-ODT) 4 MG disintegrating tablet Take 1 tablet (4 mg total) by mouth every 6 (six) hours as needed for nausea or vomiting. 15 tablet Gareth Morgan      PDMP not reviewed this encounter.   Shirlee Latch, PA-C 07/21/22 [phone]

## 2022-07-21 NOTE — ED Triage Notes (Addendum)
Pt c/o vomiting, diarrhea, leg fatigue x1day  Pt believes she has a stomach bug  Pt ate [place] necks yesterday   Pt states that her son is also throwing up but it started after her.   Pt states her diarrhea was Green and watery

## 2022-07-21 NOTE — Discharge Instructions (Signed)
ABDOMINAL PAIN: You may take Tylenol for pain relief. Use medications as directed including antiemetics and antidiarrheal medications if suggested or prescribed. You should increase fluids and electrolytes as well as rest over these next several days. If you have any questions or concerns, or if your symptoms are not improving or if especially if they acutely worsen, please call or stop back to the clinic immediately and we will be happy to help you or go to the ER   ABDOMINAL PAIN RED FLAGS: Seek immediate further care if: symptoms remain the same or worsen over the next 3-7 days, you are unable to keep fluids down, you see blood or mucus in your stool, you vomit black or dark red material, you have a fever of 101.F or higher, you have localized and/or persistent abdominal pain   

## 2022-12-26 DIAGNOSIS — R209 Unspecified disturbances of skin sensation: Secondary | ICD-10-CM | POA: Diagnosis not present

## 2022-12-26 DIAGNOSIS — Z0189 Encounter for other specified special examinations: Secondary | ICD-10-CM | POA: Diagnosis not present

## 2022-12-26 DIAGNOSIS — Z13220 Encounter for screening for lipoid disorders: Secondary | ICD-10-CM | POA: Diagnosis not present

## 2022-12-26 DIAGNOSIS — Z0000 Encounter for general adult medical examination without abnormal findings: Secondary | ICD-10-CM | POA: Diagnosis not present

## 2022-12-26 DIAGNOSIS — Z131 Encounter for screening for diabetes mellitus: Secondary | ICD-10-CM | POA: Diagnosis not present

## 2023-12-28 DIAGNOSIS — Z124 Encounter for screening for malignant neoplasm of cervix: Secondary | ICD-10-CM | POA: Diagnosis not present

## 2023-12-28 DIAGNOSIS — E559 Vitamin D deficiency, unspecified: Secondary | ICD-10-CM | POA: Diagnosis not present

## 2023-12-28 DIAGNOSIS — Z0000 Encounter for general adult medical examination without abnormal findings: Secondary | ICD-10-CM | POA: Diagnosis not present

## 2024-02-05 NOTE — Progress Notes (Unsigned)
 PCP:  Pcp, No   No chief complaint on file.    HPI:      Maureen Black is a 33 y.o. G1P1001 whose LMP was No LMP recorded., presents today for her NP> 3 yrs annual examination.  Her menses are {norm/abn:715}, lasting {number: 22536} days.  Dysmenorrhea {dysmen:716}. She {does:18564} have intermenstrual bleeding.  Sex activity: {sex active: 315163}.  Last Pap: 01/07/20  Results were: no abnormalities /neg HPV DNA  Hx of STDs: {STD hx:14358}  There is no FH of breast cancer. There is no FH of ovarian cancer. The patient {does:18564} do self-breast exams.  Tobacco use: {tob:20664} Alcohol use: {Alcohol:11675} No drug use.  Exercise: {exercise:31265}  She {does:18564} get adequate calcium and Vitamin D in her diet.  Patient Active Problem List   Diagnosis Date Noted   Cholecystitis 12/16/2019   Acute cholecystitis 12/16/2019   Normal vaginal delivery 11/24/2019    Past Surgical History:  Procedure Laterality Date   DG SELECTED HSG [facility] ONLY     HYSTEROSALPINGOGRAM      Family History  Problem Relation Age of Onset   Hypertension Mother    Healthy Father    Diabetes Maternal Grandmother    Lung cancer Paternal Grandmother     Social History   Socioeconomic History   Marital status: Married    Spouse name: Armed forces logistics/support/administrative officer   Number of children: Not on file   Years of education: Not on file   Highest education level: Not on file  Occupational History   Not on file  Tobacco Use   Smoking status: Never   Smokeless tobacco: Never  Vaping Use   Vaping status: Never Used  Substance and Sexual Activity   Alcohol use: Yes    Comment: socially   Drug use: No   Sexual activity: Yes    Partners: Male    Birth control/protection: None  Other Topics Concern   Not on file  Social History Narrative   Not on file   Social Drivers of Health   Financial Resource Strain: Not on file  Food Insecurity: No Food Insecurity (12/28/2023)   [facility]  Health Care   Hunger Vital Sign    Within the past 12 months, you worried that your food would run out before you got the money to buy more.: Never true    Within the past 12 months, the food you bought just didn't last and you didn't have money to get more.: Never true  Transportation Needs: No Transportation Needs (12/28/2023)   Received from [facility]   PRAPARE - Transportation    Lack of Transportation (Medical): No    Lack of Transportation (Non-Medical): No  Physical Activity: Not on file  Stress: Not on file  Social Connections: Not on file  Intimate Partner Violence: Not on file     Current Outpatient Medications:    ondansetron  (ZOFRAN -ODT) 4 MG disintegrating tablet, Take 1 tablet (4 mg total) by mouth every 6 (six) hours as needed for nausea or vomiting., Disp: 15 tablet, Rfl: 0     ROS:  Review of Systems BREAST: No symptoms   Objective: There were no vitals taken for this visit.   OBGyn Exam  Results: No results found for this or any previous visit (from the past 24 hours).  Assessment/Plan: No diagnosis found.  No orders of the defined types were placed in this encounter.            GYN counsel {  counseling: 16159}     F/U  No follow-ups on file.  Milen Lengacher B. [place] Fassnacht, PA-C 02/05/2024 4:36 PM

## 2024-02-07 ENCOUNTER — Ambulatory Visit (INDEPENDENT_AMBULATORY_CARE_PROVIDER_SITE_OTHER): Admitting: Obstetrics and Gynecology

## 2024-02-07 ENCOUNTER — Encounter: Payer: Self-pay | Admitting: Obstetrics and Gynecology

## 2024-02-07 ENCOUNTER — Other Ambulatory Visit (HOSPITAL_COMMUNITY)
Admission: RE | Admit: 2024-02-07 | Discharge: 2024-02-07 | Disposition: A | Discharge status: Home / Self Care | Source: Ambulatory Visit | Attending: Obstetrics and Gynecology | Admitting: Obstetrics and Gynecology

## 2024-02-07 VITALS — BP 98/61 | HR 63 | Ht 64.0 in | Wt 135.0 lb

## 2024-02-07 DIAGNOSIS — Z124 Encounter for screening for malignant neoplasm of cervix: Secondary | ICD-10-CM | POA: Insufficient documentation

## 2024-02-07 DIAGNOSIS — Z1151 Encounter for screening for human papillomavirus (HPV): Secondary | ICD-10-CM | POA: Diagnosis not present

## 2024-02-07 DIAGNOSIS — Z01419 Encounter for gynecological examination (general) (routine) without abnormal findings: Secondary | ICD-10-CM | POA: Diagnosis not present

## 2024-02-07 NOTE — Patient Instructions (Signed)
 I value your feedback and you entrusting [place] with your care. If you get a [place] patient survey, I would appreciate you taking the time to let us know about your experience today. Thank you! ? ? ?

## 2024-02-13 LAB — CYTOLOGY - PAP
Adequacy: ABSENT
Comment: NEGATIVE
Diagnosis: NEGATIVE
High risk HPV: NEGATIVE

## 2024-05-30 DIAGNOSIS — J019 Acute sinusitis, unspecified: Secondary | ICD-10-CM | POA: Diagnosis not present

## 2024-08-12 ENCOUNTER — Ambulatory Visit
Admission: EM | Admit: 2024-08-12 | Discharge: 2024-08-12 | Disposition: A | Discharge status: Home / Self Care | Attending: Emergency Medicine | Admitting: Emergency Medicine

## 2024-08-12 DIAGNOSIS — H6121 Impacted cerumen, right ear: Secondary | ICD-10-CM | POA: Diagnosis not present

## 2024-08-12 DIAGNOSIS — H66002 Acute suppurative otitis media without spontaneous rupture of ear drum, left ear: Secondary | ICD-10-CM | POA: Diagnosis not present

## 2024-08-12 MED ORDER — AMOXICILLIN-POT CLAVULANATE 875-125 MG PO TABS: 1.0000 | ORAL_TABLET | Freq: Two times a day (BID) | ORAL | 0 refills | Status: AC

## 2024-08-12 MED ORDER — CARBAMIDE PEROXIDE 6.5 % OT SOLN: OTIC | 0 refills | Status: AC

## 2024-08-12 NOTE — ED Triage Notes (Signed)
 Patient to Urgent Care with complaints of  left sided ear pain/ drainage.  Symptoms started during the night.  No otc meds today.

## 2024-08-12 NOTE — ED Provider Notes (Signed)
 " [facility]    CSN: [id number] Arrival date & time: 08/12/24  1007      History   Chief Complaint Chief Complaint  Patient presents with   Otalgia    HPI Saidi Hannahmarie Asberry is a 34 y.o. female.   34 year old female, Hagen Baranek, presents to urgent care for evaluation of left ear pain/drainage that started last night. Pt used nasal spray for symptoms. Pt has not had any OTC meds since  The history is provided by the patient. No language interpreter was used.    Past Medical History:  Diagnosis Date   Abdominal pain affecting pregnancy 11/09/2019   Abnormal Pap smear of cervix 2012   Alpha thalassemia silent carrier    Constipation    Intrauterine growth restriction (IUGR) affecting care of mother, third trimester, single gestation 11/23/2019   Pregnancy conceived through in vitro fertilization 05/01/2019   [x]  ECHO at 22 weeks [  ] Growth US  third trimester [  ] IOL by 40 weeks   Supervision of high risk pregnancy, antepartum 05/01/2019   Clinic [facility] Prenatal Labs Dating Embryo transfer Blood type: O+ Genetic Screen NIPS: normal XY Antibody: Negative Anatomic US  Normal Rubella: Immune Varicella: [ ]  needs GTT 50 RPR: NR Rhogam n/a HBsAg: neg TDaP vaccine 09/23/2019  Flu Shot: HIV: negative Baby Food                                HAD:Wzhjupcz/-- (04/23 1640) Contraception  Pap: NILM 1 year ago (05/2018) CBB    CS/VBAC N/A  Suppor    Patient Active Problem List   Diagnosis Date Noted   Impacted cerumen of right ear 08/12/2024   Acute suppurative otitis media of left ear without spontaneous rupture of tympanic membrane 08/12/2024   Cholecystitis 12/16/2019   Acute cholecystitis 12/16/2019   Normal vaginal delivery 11/24/2019    Past Surgical History:  Procedure Laterality Date   DG SELECTED HSG [facility] ONLY     HYSTEROSALPINGOGRAM      OB History     Gravida  1   Para  1   Term  1   Preterm  0   AB  0   Living  1      SAB  0   IAB   0   Ectopic  0   Multiple  0   Live Births  1            Home Medications    Prior to Admission medications  Medication Sig Start Date End Date Taking? Authorizing Provider  amoxicillin -clavulanate (AUGMENTIN ) 875-125 MG tablet Take 1 tablet by mouth every 12 (twelve) hours. 08/12/24  Yes Julia Alkhatib, NP  carbamide peroxide (DEBROX) 6.5 % OTIC solution Use as label directed for ear wax removal 08/12/24  Yes Shiquan Mathieu, Rilla, NP    Family History Family History  Problem Relation Age of Onset   Hypertension Mother    Healthy Father    Diabetes Maternal Grandmother    Lung cancer Paternal Grandmother     Social History Social History[1]   Allergies   Patient has no known allergies.   Review of Systems Review of Systems  Constitutional:  Negative for fever.  HENT:  Positive for congestion, ear discharge and ear pain.   All other systems reviewed and are negative.    Physical Exam Triage Vital Signs ED Triage Vitals  Encounter Vitals Group  BP      Girls Systolic BP Percentile      Girls Diastolic BP Percentile      Boys Systolic BP Percentile      Boys Diastolic BP Percentile      Pulse      Resp      Temp      Temp src      SpO2      Weight      Height      Head Circumference      Peak Flow      Pain Score      Pain Loc      Pain Education      Exclude from Growth Chart    No data found.  Updated Vital Signs BP 114/72   Pulse 72   Temp 98.4 F (36.9 C)   Resp 18   Wt 135 lb 12.8 oz (61.6 kg)   LMP 08/05/2024   SpO2 100%   BMI 23.31 kg/m   Visual Acuity Right Eye Distance:   Left Eye Distance:   Bilateral Distance:    Right Eye Near:   Left Eye Near:    Bilateral Near:     Physical Exam Vitals and nursing note reviewed.  Constitutional:      General: She is not in acute distress.    Appearance: She is well-developed and well-groomed.  HENT:     Head: Normocephalic.     Right Ear: External ear normal. There is  impacted cerumen. Tympanic membrane is retracted.     Left Ear: External ear normal. Tympanic membrane is erythematous and bulging.     Ears:     Comments: No mastoid redness or tenderness    Nose: Mucosal edema and congestion present.     Mouth/Throat:     Lips: Pink.     Mouth: Mucous membranes are moist.     Pharynx: Oropharynx is clear. Uvula midline.  Eyes:     General: Lids are normal.     Conjunctiva/sclera: Conjunctivae normal.     Pupils: Pupils are equal, round, and reactive to light.  Neck:     Trachea: No tracheal deviation.  Cardiovascular:     Rate and Rhythm: Normal rate and regular rhythm.     Heart sounds: Normal heart sounds. No murmur heard. Pulmonary:     Effort: Pulmonary effort is normal.     Breath sounds: Normal breath sounds and air entry.  Abdominal:     General: Bowel sounds are normal.     Palpations: Abdomen is soft.     Tenderness: There is no abdominal tenderness.  Musculoskeletal:        General: Normal range of motion.     Cervical back: Normal range of motion.  Lymphadenopathy:     Cervical: No cervical adenopathy.  Skin:    General: Skin is warm and dry.     Findings: No rash.  Neurological:     General: No focal deficit present.     Mental Status: She is alert and oriented to person, place, and time.     GCS: GCS eye subscore is 4. GCS verbal subscore is 5. GCS motor subscore is 6.  Psychiatric:        Attention and Perception: Attention normal.        Mood and Affect: Mood normal.        Speech: Speech normal.        Behavior: Behavior normal. Behavior is cooperative.  UC Treatments / Results  Labs (all labs ordered are listed, but only abnormal results are displayed) Labs Reviewed - No data to display  EKG   Radiology No results found.  Procedures Procedures (including critical care time)  Medications Ordered in UC Medications - No data to display  Initial Impression / Assessment and Plan / UC Course  I have  reviewed the triage vital signs and the nursing notes.  Pertinent labs & imaging results that were available during my care of the patient were reviewed by me and considered in my medical decision making (see chart for details).     Discussed exam findings with patient: We attempted to help remove earwax from right ear. Avoid using qtips or bobby pins in ears as you could damage your ears May use debrox ear wax removal kit in future-use as label directed Take augmentin  for left ear infection Drink plenty of water.  Follow up with PCP May take tylenol  or ibuprofen  as label directed for any pain/fever. Follow up with ENT-call for appt. Pt verbalized understanding to this provider.  Ddx: Left acute OM, right cerumen buildup, sinusitis, sinus congestion Final Clinical Impressions(s) / UC Diagnoses   Final diagnoses:  Impacted cerumen of right ear  Acute suppurative otitis media of left ear without spontaneous rupture of tympanic membrane, recurrence not specified     Discharge Instructions      We attempted to help remove earwax from right ear. Avoid using qtips or bobby pins in ears as you could damage your ears May use debrox ear wax removal kit in future-use as label directed Take augmentin  for left ear infection Drink plenty of water.  Follow up with PCP May take tylenol  or ibuprofen  as label directed for any pain/fever     ED Prescriptions     Medication Sig Dispense Auth. Provider   amoxicillin -clavulanate (AUGMENTIN ) 875-125 MG tablet Take 1 tablet by mouth every 12 (twelve) hours. 14 tablet Bryar Rennie, NP   carbamide peroxide (DEBROX) 6.5 % OTIC solution Use as label directed for ear wax removal 15 mL Julieth Tugman, Rilla, NP      PDMP not reviewed this encounter.     [1]  Social History Tobacco Use   Smoking status: Never   Smokeless tobacco: Never  Vaping Use   Vaping status: Never Used  Substance Use Topics   Alcohol use: Yes    Comment: socially    Drug use: No     Tayelor Osborne, Rilla, NP 08/12/24 1041  "

## 2024-08-12 NOTE — Discharge Instructions (Addendum)
 We attempted to help remove earwax from right ear. Avoid using qtips or bobby pins in ears as you could damage your ears May use debrox ear wax removal kit in future-use as label directed Take augmentin  for left ear infection Drink plenty of water.  Follow up with PCP May take tylenol  or ibuprofen  as label directed for any pain/fever
